# Patient Record
Sex: Male | Born: 1975 | Race: Black or African American | Hispanic: No | Marital: Single
Health system: Midwestern US, Community
[De-identification: ages and names within clinical notes are randomized; demographics above are authoritative.]

## PROBLEM LIST (undated history)

## (undated) ENCOUNTER — Ambulatory Visit (HOSPITAL_COMMUNITY): Payer: PRIVATE HEALTH INSURANCE

---

## 2003-11-22 ENCOUNTER — Emergency Department (HOSPITAL_COMMUNITY): Admission: EM | Admit: 2003-11-22 | Discharge: 2003-11-22 | Payer: Self-pay | Admitting: Emergency Medicine

## 2015-12-11 ENCOUNTER — Encounter (HOSPITAL_COMMUNITY): Payer: Self-pay | Admitting: *Deleted

## 2015-12-11 DIAGNOSIS — F419 Anxiety disorder, unspecified: Secondary | ICD-10-CM | POA: Diagnosis not present

## 2015-12-11 DIAGNOSIS — R071 Chest pain on breathing: Secondary | ICD-10-CM | POA: Diagnosis present

## 2015-12-11 DIAGNOSIS — F17200 Nicotine dependence, unspecified, uncomplicated: Secondary | ICD-10-CM | POA: Insufficient documentation

## 2015-12-11 NOTE — ED Triage Notes (Signed)
The pt is c/o chest pain since early this am sl sob  He has a history of the same no cardiac history

## 2015-12-12 ENCOUNTER — Emergency Department (HOSPITAL_COMMUNITY): Payer: BLUE CROSS/BLUE SHIELD

## 2015-12-12 ENCOUNTER — Emergency Department (HOSPITAL_COMMUNITY)
Admission: EM | Admit: 2015-12-12 | Discharge: 2015-12-12 | Disposition: A | Discharge status: Home / Self Care | Payer: BLUE CROSS/BLUE SHIELD | Attending: Emergency Medicine | Admitting: Emergency Medicine

## 2015-12-12 DIAGNOSIS — F419 Anxiety disorder, unspecified: Secondary | ICD-10-CM

## 2015-12-12 DIAGNOSIS — R0789 Other chest pain: Secondary | ICD-10-CM

## 2015-12-12 LAB — BASIC METABOLIC PANEL
Anion gap: 9 (ref 5–15)
BUN: 10 mg/dL (ref 6–20)
CHLORIDE: 105 mmol/L (ref 101–111)
CO2: 24 mmol/L (ref 22–32)
CREATININE: 1.22 mg/dL (ref 0.61–1.24)
Calcium: 9.3 mg/dL (ref 8.9–10.3)
GFR calc Af Amer: >60 mL/min (ref >60)
GFR calc non Af Amer: >60 mL/min (ref >60)
Glucose, Bld: 106 mg/dL — ABNORMAL HIGH (ref 65–99)
POTASSIUM: 3.4 mmol/L — AB (ref 3.5–5.1)
Sodium: 138 mmol/L (ref 135–145)

## 2015-12-12 LAB — CBC
HEMATOCRIT: 43.5 % (ref 39.0–52.0)
Hemoglobin: 15.2 g/dL (ref 13.0–17.0)
MCH: 28.1 pg (ref 26.0–34.0)
MCHC: 34.9 g/dL (ref 30.0–36.0)
MCV: 80.6 fL (ref 78.0–100.0)
PLATELETS: 166 10*3/uL (ref 150–400)
RBC: 5.4 MIL/uL (ref 4.22–5.81)
RDW: 12.5 % (ref 11.5–15.5)
WBC: 5.2 10*3/uL (ref 4.0–10.5)

## 2015-12-12 LAB — TROPONIN I: Troponin I: <0.03 ng/mL (ref <0.03)

## 2015-12-12 LAB — I-STAT TROPONIN, ED: Troponin i, poc: 0 ng/mL (ref 0.00–0.08)

## 2015-12-12 MED ORDER — IBUPROFEN 600 MG PO TABS: 600.0000 mg | ORAL_TABLET | Freq: Four times a day (QID) | ORAL | 0 refills | Status: AC | PRN

## 2015-12-12 MED ORDER — GI COCKTAIL ~~LOC~~
30.0000 mL | Freq: Once | ORAL | Status: AC
  Administered 2015-12-12: 30 mL via ORAL
  Filled 2015-12-12: qty 30

## 2015-12-12 MED ORDER — LORAZEPAM 1 MG PO TABS
1.0000 mg | ORAL_TABLET | Freq: Once | ORAL | Status: AC
  Administered 2015-12-12: 1 mg via ORAL
  Filled 2015-12-12: qty 1

## 2015-12-12 MED ORDER — LORAZEPAM 0.5 MG PO TABS: 0.5000 mg | ORAL_TABLET | Freq: Three times a day (TID) | ORAL | 0 refills | Status: AC | PRN

## 2015-12-12 NOTE — ED Notes (Signed)
ED Provider at bedside. 

## 2015-12-12 NOTE — ED Provider Notes (Signed)
[facility] Provider Note  CSN: [id number] Arrival date & time: 12/11/15  2345  History   Chief Complaint Chief Complaint  Patient presents with  . Chest Pain    HPI Maurice DawleyMichael Misenheimer is a 40 y.o. male.  HPI  Patient presented to the emergency department for evaluation of chest pain started while he was working on writing. He sees a Therapist, sportspsychiatrist and takes Vyvanse. He also suffers from significant anxiety and says that a lot has been going on. He states that while being concerned about a situation he developed left-sided chest pain shortness of breath. The pain lasts 1-2 minutes when it, and they will go away for a few minutes up to an hour before he returns again. The pain worsens with a large breath. He endorses feeling anxious. He is not had any radiation, diaphoresis, nausea, vomiting, cough, fevers, abdominal pain, back pain, weakness or any other associated symptoms. He denies history of cardiac disease.  History reviewed. No pertinent past medical history.  There are no active problems to display for this patient.   History reviewed. No pertinent surgical history.     Home Medications    Prior to Admission medications   Medication Sig Start Date End Date Taking? Authorizing Provider  ibuprofen (ADVIL,MOTRIN) 600 MG tablet Take 1 tablet (600 mg total) by mouth every 6 (six) hours as needed. 12/12/15   Tanishi Nault Neva SeatGreene, PA-C  LORazepam (ATIVAN) 0.5 MG tablet Take 1-2 tablets (0.5-1 mg total) by mouth 3 (three) times daily as needed for anxiety. 12/12/15   Donicia Druck Neva SeatGreene, PA-C  VYVANSE 40 MG capsule Take 40 mg by mouth daily. 11/22/15   Historical Provider, MD    Family History No family history on file.  Social History Social History  Substance Use Topics  . Smoking status: Light Tobacco Smoker  . Smokeless tobacco: Never Used  . Alcohol use Yes     Allergies   Review of patient's allergies indicates no known allergies.   Review of Systems Review of  Systems  Review of Systems All other systems negative except as documented in the HPI. All pertinent positives and negatives as reviewed in the HPI.  Physical Exam Updated Vital Signs BP 141/84   Pulse (!) 59   Temp 98.1 F (36.7 C) (Oral)   Resp 17   Ht 5\' 10"  (1.778 m)   Wt 95.3 kg   SpO2 96%   BMI 30.13 kg/m   Physical Exam  Constitutional: He appears well-developed and well-nourished. No distress.  HENT:  Head: Normocephalic and atraumatic.  Right Ear: Tympanic membrane and ear canal normal.  Left Ear: Tympanic membrane and ear canal normal.  Nose: Nose normal.  Mouth/Throat: Uvula is midline, oropharynx is clear and moist and mucous membranes are normal.  Eyes: Pupils are equal, round, and reactive to light.  Neck: Normal range of motion. Neck supple.  Cardiovascular: Normal rate and regular rhythm.   Pulmonary/Chest: Effort normal and breath sounds normal. No apnea. No respiratory distress. He exhibits tenderness.    Abdominal: Soft.  No signs of abdominal distention  Musculoskeletal:  No LE swelling  Neurological: He is alert.  Acting at baseline  Skin: Skin is warm and dry. No rash noted.  Psychiatric: His mood appears anxious.  Nursing note and vitals reviewed.    ED Treatments / Results  Labs (all labs ordered are listed, but only abnormal results are displayed) Labs Reviewed  BASIC METABOLIC PANEL - Abnormal; Notable for the following:  Result Value   Potassium 3.4 (*)    Glucose, Bld 106 (*)    All other components within normal limits  CBC  TROPONIN I  I-STAT TROPOININ, ED    EKG  EKG Interpretation None       Radiology Dg Chest 2 View  Result Date: 12/12/2015 CLINICAL DATA:  Acute onset of left-sided chest burning. Initial encounter. EXAM: CHEST  2 VIEW COMPARISON:  None. FINDINGS: The lungs are well-aerated and clear. There is no evidence of focal opacification, pleural effusion or pneumothorax. The heart is normal in size; the  mediastinal contour is within normal limits. No acute osseous abnormalities are seen. IMPRESSION: No acute cardiopulmonary process seen. Electronically Signed   By: Roanna RaiderJeffery  Chang M.D.   On: 12/12/2015 01:09    Procedures Procedures (including critical care time)  Medications Ordered in ED Medications  LORazepam (ATIVAN) tablet 1 mg (1 mg Oral Given 12/12/15 0255)  gi cocktail (Maalox,Lidocaine,Donnatal) (30 mLs Oral Given 12/12/15 0255)     Initial Impression / Assessment and Plan / ED Course  I have reviewed the triage vital signs and the nursing notes.  Pertinent labs & imaging results that were available during my care of the patient were reviewed by me and considered in my medical decision making (see chart for details).  Clinical Course    Patient had a normal chest x-ray, CBC, BMP and normal troponins 2. He has a high score of 0 and is very low risk for acute coronary syndrome. He was given Ativan and a GI cocktail in the emergency department and reports feeling significantly better. He requests prescription for Ativan and plans to talk to a psychiatrist regarding his anxiety. At this time he has been monitored and is pain-free. 11 follow-up with his primary care doctor and we discussed strict return to emergency department precautions.  Final Clinical Impressions(s) / ED Diagnoses   Final diagnoses:  Anxiety  Atypical chest pain    New Prescriptions New Prescriptions   IBUPROFEN (ADVIL,MOTRIN) 600 MG TABLET    Take 1 tablet (600 mg total) by mouth every 6 (six) hours as needed.   LORAZEPAM (ATIVAN) 0.5 MG TABLET    Take 1-2 tablets (0.5-1 mg total) by mouth 3 (three) times daily as needed for anxiety.     Marlon Peliffany Christeen Lai, PA-C 12/12/15 [id number]    Gilda Creasehristopher J Pollina, MD 12/12/15 [phone]

## 2016-10-15 ENCOUNTER — Emergency Department (HOSPITAL_COMMUNITY)
Admission: EM | Admit: 2016-10-15 | Discharge: 2016-10-15 | Disposition: A | Discharge status: Home / Self Care | Payer: Self-pay | Attending: Emergency Medicine | Admitting: Emergency Medicine

## 2016-10-15 ENCOUNTER — Encounter (HOSPITAL_COMMUNITY): Payer: Self-pay

## 2016-10-15 ENCOUNTER — Emergency Department (HOSPITAL_COMMUNITY): Payer: Self-pay

## 2016-10-15 DIAGNOSIS — R072 Precordial pain: Secondary | ICD-10-CM | POA: Insufficient documentation

## 2016-10-15 DIAGNOSIS — R030 Elevated blood-pressure reading, without diagnosis of hypertension: Secondary | ICD-10-CM | POA: Insufficient documentation

## 2016-10-15 DIAGNOSIS — F17200 Nicotine dependence, unspecified, uncomplicated: Secondary | ICD-10-CM | POA: Insufficient documentation

## 2016-10-15 DIAGNOSIS — K219 Gastro-esophageal reflux disease without esophagitis: Secondary | ICD-10-CM | POA: Insufficient documentation

## 2016-10-15 LAB — I-STAT TROPONIN, ED
Troponin i, poc: 0 ng/mL (ref 0.00–0.08)
Troponin i, poc: 0 ng/mL (ref 0.00–0.08)

## 2016-10-15 LAB — BASIC METABOLIC PANEL
Anion gap: 10 (ref 5–15)
BUN: 11 mg/dL (ref 6–20)
CHLORIDE: 102 mmol/L (ref 101–111)
CO2: 26 mmol/L (ref 22–32)
CREATININE: 1.35 mg/dL — AB (ref 0.61–1.24)
Calcium: 9.6 mg/dL (ref 8.9–10.3)
GFR calc Af Amer: >60 mL/min (ref >60)
GLUCOSE: 96 mg/dL (ref 65–99)
Potassium: 3.8 mmol/L (ref 3.5–5.1)
SODIUM: 138 mmol/L (ref 135–145)

## 2016-10-15 LAB — CBC
HEMATOCRIT: 44.5 % (ref 39.0–52.0)
HEMOGLOBIN: 15.3 g/dL (ref 13.0–17.0)
MCH: 27.4 pg (ref 26.0–34.0)
MCHC: 34.4 g/dL (ref 30.0–36.0)
MCV: 79.6 fL (ref 78.0–100.0)
Platelets: 162 10*3/uL (ref 150–400)
RBC: 5.59 MIL/uL (ref 4.22–5.81)
RDW: 12.7 % (ref 11.5–15.5)
WBC: 4.9 10*3/uL (ref 4.0–10.5)

## 2016-10-15 MED ORDER — GI COCKTAIL ~~LOC~~
30.0000 mL | Freq: Once | ORAL | Status: AC
  Administered 2016-10-15: 30 mL via ORAL
  Filled 2016-10-15: qty 30

## 2016-10-15 MED ORDER — OMEPRAZOLE 20 MG PO CPDR: 20.0000 mg | DELAYED_RELEASE_CAPSULE | Freq: Every day | ORAL | 0 refills | Status: AC

## 2016-10-15 NOTE — ED Notes (Signed)
ED Provider at bedside. 

## 2016-10-15 NOTE — ED Notes (Signed)
Pt's wife at desk inquiring about wait times

## 2016-10-15 NOTE — ED Notes (Signed)
Pt stat he understands iinstructions. Home stable with wife. With steady gait.

## 2016-10-15 NOTE — ED Notes (Signed)
Pt stats he had severe chest pain earlier weith short of breath. Pt eating muffin and NAD at present.

## 2016-10-15 NOTE — ED Triage Notes (Addendum)
Per Pt, Pt is coming from home with intermittent left to mid-center chest pain that started about a week ago. Pt reports some SOB and lightheadedness. Denies N/V/D, Numbness or Tingling.

## 2016-10-15 NOTE — ED Provider Notes (Signed)
[facility] Provider Note   CSN: [id number] Arrival date & time: 10/15/16  1248     History   Chief Complaint Chief Complaint  Patient presents with  . Chest Pain    HPI Maurice DawleyMichael Gonzales is a 41 y.o. male.  Maurice DawleyMichael Gonzales is a 41 y.o. Male who presents to the ED complaining of chest pain ongoing for 3-4 weeks. He reports his pain has gradually become worse over the past few weeks. He reports substernal and left sided chest pain that fluctuates in intensity. He reports he has pain everyday but not when he is sleeping. He woke up this morning with pain that then became worse before he got to the ER today. He reports he had more severe pain prior to the ER today with feeling like there is a knot in his chest and that it hurt to breathe. He does not have this pain any longer. He is unable to identify any alleviating or aggravating factors for his chest pain. He denies worsening pain with exertion, breathing, eating or laying down. He does report associated acid reflux symptoms as well as some anxiety. He was seen in the emergency department for chest pain previously and was directed to follow-up with primary care and cardiology. He is yet to follow-up with cardiology. He does have an appointment next week with his primary care doctor. He is not a smoker. He denies history of hypertension or hyperlipidemia. He denies fevers, coughing, neck pain, numbness, tingling, weakness, changes to his vision, back pain, lightheadedness, seen to be, leg pain, leg swelling, recent long travel or rashes.   The history is provided by the patient, medical records and the spouse. No language interpreter was used.  Chest Pain   Pertinent negatives include no abdominal pain, no back pain, no cough, no fever, no headaches, no nausea, no numbness, no palpitations, no shortness of breath, no vomiting and no weakness.    History reviewed. No pertinent past medical history.  There are no active problems to display  for this patient.   History reviewed. No pertinent surgical history.     Home Medications    Prior to Admission medications   Medication Sig Start Date End Date Taking? Authorizing Provider  ibuprofen (ADVIL,MOTRIN) 600 MG tablet Take 1 tablet (600 mg total) by mouth every 6 (six) hours as needed. 12/12/15   Marlon PelGreene, Tiffany, PA-C  LORazepam (ATIVAN) 0.5 MG tablet Take 1-2 tablets (0.5-1 mg total) by mouth 3 (three) times daily as needed for anxiety. 12/12/15   Marlon PelGreene, Tiffany, PA-C  omeprazole (PRILOSEC) 20 MG capsule Take 1 capsule (20 mg total) by mouth daily. 10/15/16   Everlene Farrieransie, Zeeshan Korte, PA-C  VYVANSE 40 MG capsule Take 40 mg by mouth daily. 11/22/15   [provider]    Family History No family history on file.  Social History Social History  Substance Use Topics  . Smoking status: Light Tobacco Smoker  . Smokeless tobacco: Never Used  . Alcohol use Yes     Comment: Occasiocally      Allergies   Patient has no known allergies.   Review of Systems Review of Systems  Constitutional: Negative for chills and fever.  HENT: Negative for congestion and sore throat.   Eyes: Negative for visual disturbance.  Respiratory: Negative for cough, shortness of breath and wheezing.   Cardiovascular: Positive for chest pain. Negative for palpitations and leg swelling.  Gastrointestinal: Negative for abdominal pain, diarrhea, nausea and vomiting.  Genitourinary: Negative for dysuria.  Musculoskeletal:  Negative for back pain and neck pain.  Skin: Negative for rash.  Neurological: Negative for syncope, weakness, light-headedness, numbness and headaches.     Physical Exam Updated Vital Signs BP (!) 132/100   Pulse 66   Temp 98 F (36.7 C) (Oral)   Resp 16   Ht  (1.778 m)   Wt 99.8 kg (220 lb)   SpO2 94%   BMI 31.57 kg/m   Physical Exam  Constitutional: He appears well-developed and well-nourished. No distress.  Nontoxic appearing.  HENT:  Head: Normocephalic  and atraumatic.  Right Ear: External ear normal.  Left Ear: External ear normal.  Mouth/Throat: Oropharynx is clear and moist.  Eyes: Pupils are equal, round, and reactive to light. Conjunctivae are normal. Right eye exhibits no discharge. Left eye exhibits no discharge.  Neck: Neck supple. No JVD present.  Cardiovascular: Normal rate, regular rhythm, normal heart sounds and intact distal pulses.  Exam reveals no gallop and no friction rub.   No murmur heard. Bilateral radial, posterior tibialis and dorsalis pedis pulses are intact.    Pulmonary/Chest: Effort normal and breath sounds normal. No stridor. No respiratory distress. He has no wheezes. He has no rales. He exhibits no tenderness.  Lungs are clear to ascultation bilaterally. Symmetric chest expansion bilaterally. No increased work of breathing. No rales or rhonchi.    Abdominal: Soft. There is no tenderness. There is no guarding.  Musculoskeletal: He exhibits no edema or tenderness.  No lower extremity edema or tenderness.  Lymphadenopathy:    He has no cervical adenopathy.  Neurological: He is alert. No sensory deficit. Coordination normal.  Skin: Skin is warm and dry. Capillary refill takes less than 2 seconds. No rash noted. He is not diaphoretic. No erythema. No pallor.  Psychiatric: He has a normal mood and affect. His behavior is normal.  Nursing note and vitals reviewed.    ED Treatments / Results  Labs (all labs ordered are listed, but only abnormal results are displayed) Labs Reviewed  BASIC METABOLIC PANEL - Abnormal; Notable for the following:       Result Value   Creatinine, Ser 1.35 (*)    All other components within normal limits  CBC  I-STAT TROPONIN, ED  I-STAT TROPONIN, ED    EKG  EKG Interpretation  Date/Time:  Saturday October 15 2016 12:50:22 EDT Ventricular Rate:  71 PR Interval:  148 QRS Duration: 82 QT Interval:  360 QTC Calculation: 391 R Axis:   -15 Text Interpretation:  Normal sinus  rhythm Nonspecific T wave abnormality Abnormal ECG Confirmed by Ranae Palms  MD, DAVID ([id number]) on 10/15/2016 4:43:29 PM       Radiology Dg Chest 2 View  Result Date: 10/15/2016 CLINICAL DATA:  Chest pain. EXAM: CHEST  2 VIEW COMPARISON:  Radiographs of December 12, 2015. FINDINGS: The heart size and mediastinal contours are within normal limits. Both lungs are clear. No pneumothorax or pleural effusion is noted. The visualized skeletal structures are unremarkable. IMPRESSION: No active cardiopulmonary disease. Electronically Signed   By: Lupita Raider, M.D.   On: 10/15/2016 13:43    Procedures Procedures (including critical care time)  Medications Ordered in ED Medications  gi cocktail (Maalox,Lidocaine,Donnatal) (30 mLs Oral Given 10/15/16 1750)     Initial Impression / Assessment and Plan / ED Course  I have reviewed the triage vital signs and the nursing notes.  Pertinent labs & imaging results that were available during my care of the patient were reviewed by me and  considered in my medical decision making (see chart for details).     This is a 41 y.o. Male who presents to the ED complaining of chest pain ongoing for 3-4 weeks. He reports his pain has gradually become worse over the past few weeks. He reports substernal and left sided chest pain that fluctuates in intensity. He reports he has pain everyday but not when he is sleeping. He woke up this morning with pain that then became worse before he got to the ER today. He reports he had more severe pain prior to the ER today with feeling like there is a knot in his chest and that it hurt to breathe. He does not have this pain any longer. He is unable to identify any alleviating or aggravating factors for his chest pain. He denies worsening pain with exertion, breathing, eating or laying down. He does report associated acid reflux symptoms as well as some anxiety. He does have an appointment next week with his primary care doctor. He is not  a smoker. He denies history of hypertension or hyperlipidemia. On examination patient is afebrile and nontoxic appearing. Lungs are clear to auscultation bilaterally. No hypoxia, tachypnea or tachycardia on exam. Blood pressure is noted to be elevated. He denies history of hypertension. He is not on any antihypertensives. EKG shows nonspecific T-wave abnormality. No evidence of STEMI. Chest x-ray is unremarkable. BMP is normal for creatinine of 1.35. Most recent creatinine is about 8 months ago and was 1.22.CBC is within normal limits. Initial troponin is not elevated. Delta troponin is also not elevated. Doubt ACS or PE.  During the patient's emergency department visit he denies any chest pain to me. He did receive a GI cocktail and prior to discharge she reports he has no pain. Hedoes report some anxiety as well as acid reflux symptoms. This might be recurrent contributing to his symptoms. He does have elevated blood pressure and I feel he probably needs close follow-up by primary care for blood pressure recheck. I also noted that his creatinine is somewhat elevated, although very close to his baseline from his most recent blood work. I did encourage him to have his blood pressure and creatinine rechecked by primary care at his appointment next week. He agrees with this plan. Will start him on omeprazole to see if this helps with his symptoms. I discussed strict and specific return precautions with the patient. I advised the patient to follow-up with their primary care provider this week. I advised the patient to return to the emergency department with new or worsening symptoms or new concerns. The patient verbalized understanding and agreement with plan.   This patient was discussed with Dr. Ranae Palms who agrees with assessment and plan.   Final Clinical Impressions(s) / ED Diagnoses   Final diagnoses:  Precordial pain  Gastroesophageal reflux disease, esophagitis presence not specified  Elevated blood  pressure reading    New Prescriptions New Prescriptions   OMEPRAZOLE (PRILOSEC) 20 MG CAPSULE    Take 1 capsule (20 mg total) by mouth daily.     Everlene Farrier, PA-C 10/15/16 1810    Loren Racer, MD 10/16/16 [phone]

## 2017-05-05 ENCOUNTER — Emergency Department (HOSPITAL_COMMUNITY)
Admission: EM | Admit: 2017-05-05 | Discharge: 2017-05-05 | Disposition: A | Discharge status: Home / Self Care | Payer: BLUE CROSS/BLUE SHIELD | Attending: Emergency Medicine | Admitting: Emergency Medicine

## 2017-05-05 ENCOUNTER — Encounter (HOSPITAL_COMMUNITY): Payer: Self-pay | Admitting: *Deleted

## 2017-05-05 ENCOUNTER — Emergency Department (HOSPITAL_COMMUNITY): Payer: BLUE CROSS/BLUE SHIELD

## 2017-05-05 ENCOUNTER — Other Ambulatory Visit: Payer: Self-pay

## 2017-05-05 DIAGNOSIS — R079 Chest pain, unspecified: Secondary | ICD-10-CM | POA: Insufficient documentation

## 2017-05-05 DIAGNOSIS — Z5321 Procedure and treatment not carried out due to patient leaving prior to being seen by health care provider: Secondary | ICD-10-CM | POA: Insufficient documentation

## 2017-05-05 LAB — BASIC METABOLIC PANEL
Anion gap: 11 (ref 5–15)
BUN: 8 mg/dL (ref 6–20)
CHLORIDE: 101 mmol/L (ref 101–111)
CO2: 25 mmol/L (ref 22–32)
CREATININE: 1.3 mg/dL — AB (ref 0.61–1.24)
Calcium: 9.4 mg/dL (ref 8.9–10.3)
GFR calc non Af Amer: >60 mL/min (ref >60)
Glucose, Bld: 117 mg/dL — ABNORMAL HIGH (ref 65–99)
Potassium: 3.6 mmol/L (ref 3.5–5.1)
Sodium: 137 mmol/L (ref 135–145)

## 2017-05-05 LAB — CBC
HCT: 46.8 % (ref 39.0–52.0)
Hemoglobin: 16.3 g/dL (ref 13.0–17.0)
MCH: 28.4 pg (ref 26.0–34.0)
MCHC: 34.8 g/dL (ref 30.0–36.0)
MCV: 81.7 fL (ref 78.0–100.0)
PLATELETS: 177 10*3/uL (ref 150–400)
RBC: 5.73 MIL/uL (ref 4.22–5.81)
RDW: 12.6 % (ref 11.5–15.5)
WBC: 6.4 10*3/uL (ref 4.0–10.5)

## 2017-05-05 LAB — I-STAT TROPONIN, ED: TROPONIN I, POC: 0 ng/mL (ref 0.00–0.08)

## 2017-05-05 NOTE — ED Triage Notes (Signed)
Pt in c/o L sided CP that radiates R arm onset today that is intermittent, pt reports intermittent pain, pt denies current SOB, pt reports x 2 emesis episodes with episode today, v/d, A&O x4

## 2018-04-08 ENCOUNTER — Other Ambulatory Visit: Payer: Self-pay

## 2018-04-08 ENCOUNTER — Encounter (HOSPITAL_COMMUNITY): Payer: Self-pay

## 2018-04-08 ENCOUNTER — Ambulatory Visit (HOSPITAL_COMMUNITY)
Admission: EM | Admit: 2018-04-08 | Discharge: 2018-04-08 | Disposition: A | Discharge status: Home / Self Care | Payer: BLUE CROSS/BLUE SHIELD | Attending: Family Medicine | Admitting: Family Medicine

## 2018-04-08 DIAGNOSIS — H109 Unspecified conjunctivitis: Secondary | ICD-10-CM

## 2018-04-08 MED ORDER — TOBRAMYCIN 0.3 % OP SOLN: 1.0000 [drp] | OPHTHALMIC | 0 refills | Status: AC

## 2018-04-08 MED ORDER — AMOXICILLIN 875 MG PO TABS: 875.0000 mg | ORAL_TABLET | Freq: Two times a day (BID) | ORAL | 0 refills | Status: DC

## 2018-04-08 NOTE — ED Provider Notes (Signed)
[facility]    CSN: [id number] Arrival date & time: 04/08/18  1015     History   Chief Complaint Chief Complaint  Patient presents with  . Conjunctivitis  . Cough    HPI Maurice Gonzales is a 43 y.o. male.   HPI  Maurice Gonzales has a 20-year-old son.  The son is getting over a cold and ear infection with conjunctivitis.  Now patient has a cold, runny stuffy nose, and redness of his right eye.  He has some swelling of the upper lid.  Vision is normal. No fever or chills.  No coughing or chest congestion.  History reviewed. No pertinent past medical history.  There are no active problems to display for this patient.   History reviewed. No pertinent surgical history.     Home Medications    Prior to Admission medications   Medication Sig Start Date End Date Taking? Authorizing Provider  amoxicillin (AMOXIL) 875 MG tablet Take 1 tablet (875 mg total) by mouth 2 (two) times daily. 04/08/18   Eustace Moore, MD  ibuprofen (ADVIL,MOTRIN) 600 MG tablet Take 1 tablet (600 mg total) by mouth every 6 (six) hours as needed. 12/12/15   Marlon Pel, PA-C  LORazepam (ATIVAN) 0.5 MG tablet Take 1-2 tablets (0.5-1 mg total) by mouth 3 (three) times daily as needed for anxiety. 12/12/15   Marlon Pel, PA-C  omeprazole (PRILOSEC) 20 MG capsule Take 1 capsule (20 mg total) by mouth daily. 10/15/16   Everlene Farrier, PA-C  tobramycin (TOBREX) 0.3 % ophthalmic solution Place 1 drop into both eyes every 4 (four) hours. 04/08/18   Eustace Moore, MD  VYVANSE 40 MG capsule Take 40 mg by mouth daily. 11/22/15   [provider]    Family History History reviewed. No pertinent family history.  Social History Social History   Tobacco Use  . Smoking status: Light Tobacco Smoker  . Smokeless tobacco: Never Used  Substance Use Topics  . Alcohol use: Yes    Comment: Occasiocally   . Drug use: No     Allergies   Patient has no known allergies.   Review of  Systems Review of Systems  Constitutional: Negative for chills and fever.  HENT: Positive for congestion, postnasal drip and rhinorrhea. Negative for ear pain and sore throat.   Eyes: Positive for redness. Negative for pain and visual disturbance.  Respiratory: Negative for cough and shortness of breath.   Cardiovascular: Negative for chest pain and palpitations.  Gastrointestinal: Negative for abdominal pain and vomiting.  Genitourinary: Negative for dysuria and hematuria.  Musculoskeletal: Negative for arthralgias and back pain.  Skin: Negative for color change and rash.  Neurological: Negative for seizures and syncope.  All other systems reviewed and are negative.    Physical Exam Triage Vital Signs ED Triage Vitals  Enc Vitals Group     BP 04/08/18 1043 135/74     Pulse Rate 04/08/18 1043 71     Resp 04/08/18 1043 16     Temp 04/08/18 1043 98.1 F (36.7 C)     Temp Source 04/08/18 1043 Oral     SpO2 04/08/18 1043 98 %     Weight 04/08/18 1047 235 lb (106.6 kg)     Height --      Head Circumference --      Peak Flow --      Pain Score 04/08/18 1047 6     Pain Loc --      Pain Edu? --  Excl. in GC? --    No data found.  Updated Vital Signs BP 135/74 (BP Location: Left Arm)   Pulse 71   Temp 98.1 F (36.7 C) (Oral)   Resp 16   Wt 106.6 kg   SpO2 98%   BMI 33.72 kg/m   Visual Acuity Right Eye Distance:   Left Eye Distance:   Bilateral Distance:    Right Eye Near:   Left Eye Near:    Bilateral Near:     Physical Exam Constitutional:      General: He is not in acute distress.    Appearance: He is well-developed and normal weight. He is ill-appearing.  HENT:     Head: Normocephalic and atraumatic.     Right Ear: Tympanic membrane, ear canal and external ear normal.     Left Ear: Ear canal and external ear normal.     Nose: Congestion present.     Mouth/Throat:     Mouth: Mucous membranes are moist.     Pharynx: Posterior oropharyngeal erythema  present.  Eyes:     Conjunctiva/sclera: Conjunctivae normal.     Pupils: Pupils are equal, round, and reactive to light.     Comments: Right eye has moderate conjunctival injection.  Yellow discharge.  Mild swelling and tenderness of the upper lid.  Neck:     Musculoskeletal: Normal range of motion.  Cardiovascular:     Rate and Rhythm: Normal rate and regular rhythm.  Pulmonary:     Effort: Pulmonary effort is normal. No respiratory distress.     Breath sounds: Normal breath sounds.  Abdominal:     General: There is no distension.     Palpations: Abdomen is soft.  Musculoskeletal: Normal range of motion.  Skin:    General: Skin is warm and dry.  Neurological:     Mental Status: He is alert.      UC Treatments / Results  Labs (all labs ordered are listed, but only abnormal results are displayed) Labs Reviewed - No data to display  EKG None  Radiology No results found.  Procedures Procedures (including critical care time)  Medications Ordered in UC Medications - No data to display  Initial Impression / Assessment and Plan / UC Course  I have reviewed the triage vital signs and the nursing notes.  Pertinent labs & imaging results that were available during my care of the patient were reviewed by me and considered in my medical decision making (see chart for details).     I know the patient has an upper respiratory infection that is likely a virus, however, with swelling of his lid and tenderness I am worried about his conjunctivitis.  I am going to give him amoxicillin and eyedrops to try to treat this.  He will return if he is not better in a few days Final Clinical Impressions(s) / UC Diagnoses   Final diagnoses:  Bacterial conjunctivitis of right eye     Discharge Instructions     Take the amoxicillin 2 x a day May take tylenol or ibuprofen for pain and fever Use eye drops for 5-7 days until eye clears Follow up if you fail to improve   ED  Prescriptions    Medication Sig Dispense Auth. Provider   amoxicillin (AMOXIL) 875 MG tablet Take 1 tablet (875 mg total) by mouth 2 (two) times daily. 14 tablet Eustace Moore, MD   tobramycin (TOBREX) 0.3 % ophthalmic solution Place 1 drop into both eyes every 4 (  four) hours. 5 mL Eustace Moore, MD     Controlled Substance Prescriptions [place] Controlled Substance Registry consulted? Not Applicable   Eustace Moore, MD 04/08/18 [phone]

## 2018-04-08 NOTE — Discharge Instructions (Signed)
Take the amoxicillin 2 x a day May take tylenol or ibuprofen for pain and fever Use eye drops for 5-7 days until eye clears Follow up if you fail to improve

## 2018-04-08 NOTE — ED Triage Notes (Signed)
Pt cc left eye may have the pink eye. Pt has a cold, cough and congestion.

## 2018-06-18 ENCOUNTER — Encounter (HOSPITAL_COMMUNITY): Payer: Self-pay

## 2018-06-18 ENCOUNTER — Other Ambulatory Visit: Payer: Self-pay

## 2018-06-18 ENCOUNTER — Ambulatory Visit (HOSPITAL_COMMUNITY)
Admission: EM | Admit: 2018-06-18 | Discharge: 2018-06-18 | Disposition: A | Discharge status: Home / Self Care | Payer: Self-pay | Attending: Family Medicine | Admitting: Family Medicine

## 2018-06-18 DIAGNOSIS — K120 Recurrent oral aphthae: Secondary | ICD-10-CM | POA: Insufficient documentation

## 2018-06-18 LAB — POCT RAPID STREP A: Streptococcus, Group A Screen (Direct): NEGATIVE

## 2018-06-18 MED ORDER — LIDOCAINE VISCOUS HCL 2 % MT SOLN
15.0000 mL | OROMUCOSAL | 0 refills | Status: AC | PRN
Start: 2018-06-18 — End: ?

## 2018-06-18 NOTE — ED Triage Notes (Signed)
Patient presents to Urgent Care with complaints of sore throat since yesterday when he noticed his uvula was inflamed. Patient reports he noticed this morning some white spots on the back of his throat as well, pt denies fevers.

## 2018-06-18 NOTE — Discharge Instructions (Addendum)
These ulcers are most likely viral and should resolve on their own and approximately 1 to 2 weeks. You may use viscous lidocaine provided prior to meals to help with any discomfort  Please continue to monitor your symptoms over the next week, if you start to develop fever, swelling, increased pain, difficulty swallowing or difficulty breathing please follow-up here or in the emergency room.

## 2018-06-18 NOTE — ED Provider Notes (Signed)
[facility]    CSN: [id number] Arrival date & time: 06/18/18  1406     History   Chief Complaint Chief Complaint  Patient presents with  . Sore Throat    HPI Maurice Gonzales is a 43 y.o. male no significant past medical history presenting today for evaluation of sores on his mouth.  Patient notes that over the past couple days he has had some mild discomfort in his throat.  He notes that his pain is rated 1.5 out of 10.  He denies any swelling.  Denies difficulty swallowing.  Denies difficulty breathing.  Denies chest pain or shortness of breath.  Has had minimal cough and some mild rhinorrhea which she has attributed to allergies.  He denies any fevers.  Denies any new medicines.  Denies close contacts with similar symptoms.  HPI  History reviewed. No pertinent past medical history.  There are no active problems to display for this patient.   History reviewed. No pertinent surgical history.     Home Medications    Prior to Admission medications   Medication Sig Start Date End Date Taking? Authorizing Provider  ibuprofen (ADVIL,MOTRIN) 600 MG tablet Take 1 tablet (600 mg total) by mouth every 6 (six) hours as needed. 12/12/15   Marlon PelGreene, Tiffany, PA-C  lidocaine (XYLOCAINE) 2 % solution Use as directed 15 mLs in the mouth or throat every 4 (four) hours as needed for mouth pain. May swish and spit 06/18/18   Wieters, Hallie C, PA-C  LORazepam (ATIVAN) 0.5 MG tablet Take 1-2 tablets (0.5-1 mg total) by mouth 3 (three) times daily as needed for anxiety. 12/12/15   Marlon PelGreene, Tiffany, PA-C  omeprazole (PRILOSEC) 20 MG capsule Take 1 capsule (20 mg total) by mouth daily. 10/15/16   Everlene Farrieransie, William, PA-C  tobramycin (TOBREX) 0.3 % ophthalmic solution Place 1 drop into both eyes every 4 (four) hours. 04/08/18   Eustace MooreNelson, Yvonne Sue, MD  VYVANSE 40 MG capsule Take 40 mg by mouth daily. 11/22/15   [provider]    Family History History reviewed. No pertinent family history.   Social History Social History   Tobacco Use  . Smoking status: Light Tobacco Smoker    Types: Cigars  . Smokeless tobacco: Never Used  . Tobacco comment: smokes cigar every "once in a blue moon"  Substance Use Topics  . Alcohol use: Yes    Comment: Occasiocally   . Drug use: No     Allergies   Patient has no known allergies.   Review of Systems Review of Systems  Constitutional: Negative for activity change, appetite change, chills, fatigue and fever.  HENT: Positive for sore throat. Negative for congestion, ear pain, rhinorrhea, sinus pressure and trouble swallowing.   Eyes: Negative for discharge and redness.  Respiratory: Negative for cough, chest tightness and shortness of breath.   Cardiovascular: Negative for chest pain.  Gastrointestinal: Negative for abdominal pain, diarrhea, nausea and vomiting.  Musculoskeletal: Negative for myalgias.  Skin: Negative for rash.  Neurological: Negative for dizziness, light-headedness and headaches.     Physical Exam Triage Vital Signs ED Triage Vitals  Enc Vitals Group     BP 06/18/18 1441 (!) 159/90     Pulse Rate 06/18/18 1441 73     Resp 06/18/18 1441 18     Temp 06/18/18 1441 98 F (36.7 C)     Temp Source 06/18/18 1441 Oral     SpO2 06/18/18 1441 100 %     Weight --  Height --      Head Circumference --      Peak Flow --      Pain Score 06/18/18 1439 2     Pain Loc --      Pain Edu? --      Excl. in GC? --    No data found.  Updated Vital Signs BP (!) 159/90 (BP Location: Right Arm)   Pulse 73   Temp 98 F (36.7 C) (Oral)   Resp 18   SpO2 100%   Visual Acuity Right Eye Distance:   Left Eye Distance:   Bilateral Distance:    Right Eye Near:   Left Eye Near:    Bilateral Near:     Physical Exam Vitals signs and nursing note reviewed.  Constitutional:      Appearance: He is well-developed.  HENT:     Head: Normocephalic and atraumatic.     Ears:     Comments: Bilateral ears without  tenderness to palpation of external auricle, tragus and mastoid, EAC's without erythema or swelling, TM's with good bony landmarks and cone of light. Non erythematous.     Nose:     Comments: Nasal mucosa slightly erythematous, bilateral turbinates enlarged, dried blood present in left nares    Mouth/Throat:     Comments: Oral mucosa pink and moist, no tonsillar enlargement bilaterally, uvula does appear erythematous and slightly enlarged with 3 white ulcerative lesions present, posterior pharynx patent, no soft palate swelling, no other lesions noted on oral mucosa or tongue  Eyes:     Conjunctiva/sclera: Conjunctivae normal.  Neck:     Musculoskeletal: Neck supple.  Cardiovascular:     Rate and Rhythm: Normal rate and regular rhythm.     Heart sounds: No murmur.  Pulmonary:     Effort: Pulmonary effort is normal. No respiratory distress.     Breath sounds: Normal breath sounds.     Comments: Breathing comfortably at rest, CTABL, no wheezing, rales or other adventitious sounds auscultated Abdominal:     Palpations: Abdomen is soft.     Tenderness: There is no abdominal tenderness.  Skin:    General: Skin is warm and dry.  Neurological:     Mental Status: He is alert.      UC Treatments / Results  Labs (all labs ordered are listed, but only abnormal results are displayed) Labs Reviewed  CULTURE, GROUP A STREP [facility])  POCT RAPID STREP A    EKG None  Radiology No results found.  Procedures Procedures (including critical care time)  Medications Ordered in UC Medications - No data to display  Initial Impression / Assessment and Plan / UC Course  I have reviewed the triage vital signs and the nursing notes.  Pertinent labs & imaging results that were available during my care of the patient were reviewed by me and considered in my medical decision making (see chart for details).    Strep test negative, lesions appear similar to aphthous ulcers versus infection.  Notes  that his uvula is normally the size.  Minimal pain.  Will treat symptomatically and supportively, viscous lidocaine as needed, would expect self resolution.  Continue to monitor for development of swelling, fevers or persistent symptoms.Discussed strict return precautions. Patient verbalized understanding and is agreeable with plan.  Final Clinical Impressions(s) / UC Diagnoses   Final diagnoses:  Aphthous ulcer of mouth     Discharge Instructions     These ulcers are most likely viral and should resolve on  their own and approximately 1 to 2 weeks. You may use viscous lidocaine provided prior to meals to help with any discomfort  Please continue to monitor your symptoms over the next week, if you start to develop fever, swelling, increased pain, difficulty swallowing or difficulty breathing please follow-up here or in the emergency room.   ED Prescriptions    Medication Sig Dispense Auth. Provider   lidocaine (XYLOCAINE) 2 % solution Use as directed 15 mLs in the mouth or throat every 4 (four) hours as needed for mouth pain. May swish and spit 100 mL Wieters, [place] C, PA-C     Controlled Substance Prescriptions [place] Controlled Substance Registry consulted? Not Applicable   Lew Dawes, [place] 06/18/18 1524

## 2018-06-21 LAB — CULTURE, GROUP A STREP (THRC)

## 2018-09-10 ENCOUNTER — Ambulatory Visit (INDEPENDENT_AMBULATORY_CARE_PROVIDER_SITE_OTHER): Payer: Self-pay

## 2018-09-10 ENCOUNTER — Other Ambulatory Visit: Payer: Self-pay

## 2018-09-10 ENCOUNTER — Encounter (HOSPITAL_COMMUNITY): Payer: Self-pay | Admitting: Emergency Medicine

## 2018-09-10 ENCOUNTER — Ambulatory Visit (HOSPITAL_COMMUNITY)
Admission: EM | Admit: 2018-09-10 | Discharge: 2018-09-10 | Disposition: A | Discharge status: Home / Self Care | Payer: Self-pay | Attending: Emergency Medicine | Admitting: Emergency Medicine

## 2018-09-10 DIAGNOSIS — S6991XA Unspecified injury of right wrist, hand and finger(s), initial encounter: Secondary | ICD-10-CM

## 2018-09-10 NOTE — Discharge Instructions (Signed)
Ice, elevation, ibuprofen or aleve as needed to help with swelling.  Brace for comfort and rest, wean out of it as able. Limit activity such as punching until swelling has improved.  I would recommend following up with sports medicine for further evaluation and recommendations.

## 2018-09-10 NOTE — ED Triage Notes (Signed)
Pt here for right hand pain after hitting punching bag yesterday

## 2018-09-10 NOTE — ED Provider Notes (Signed)
[facility]    CSN: [id number] Arrival date & time: 09/10/18  1858     History   Chief Complaint Chief Complaint  Patient presents with  . Hand Pain    HPI Maurice DawleyMichael Gonzales is a 43 y.o. male.   Maurice DawleyMichael Gross presents with complaints of right hand pain. 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 Did not have immediate pain but noted pain later that night. Swelling predominates to the right hand. Not necessarily painful. Hasn't taken any medications. No treatments. No previous injury to the hand. No numbness or tingling. No weakness. He is right handed. Without contributing medical history.      ROS per HPI, negative if not otherwise mentioned.      History reviewed. No pertinent past medical history.  There are no active problems to display for this patient.   History reviewed. No pertinent surgical history.     Home Medications    Prior to Admission medications   Medication Sig Start Date End Date Taking? Authorizing Provider  ibuprofen (ADVIL,MOTRIN) 600 MG tablet Take 1 tablet (600 mg total) by mouth every 6 (six) hours as needed. 12/12/15   Marlon PelGreene, Tiffany, PA-C  lidocaine (XYLOCAINE) 2 % solution Use as directed 15 mLs in the mouth or throat every 4 (four) hours as needed for mouth pain. May swish and spit 06/18/18   Wieters, Hallie C, PA-C  LORazepam (ATIVAN) 0.5 MG tablet Take 1-2 tablets (0.5-1 mg total) by mouth 3 (three) times daily as needed for anxiety. 12/12/15   Marlon PelGreene, Tiffany, PA-C  omeprazole (PRILOSEC) 20 MG capsule Take 1 capsule (20 mg total) by mouth daily. 10/15/16   Everlene Farrieransie, William, PA-C  tobramycin (TOBREX) 0.3 % ophthalmic solution Place 1 drop into both eyes every 4 (four) hours. 04/08/18   Eustace MooreNelson, Yvonne Sue, MD  VYVANSE 40 MG capsule Take 40 mg by mouth daily. 11/22/15   [provider]    Family History History reviewed. No pertinent family history.  Social History Social History   Tobacco Use  . Smoking  status: Light Tobacco Smoker    Types: Cigars  . Smokeless tobacco: Never Used  . Tobacco comment: smokes cigar every "once in a blue moon"  Substance Use Topics  . Alcohol use: Yes    Comment: Occasiocally   . Drug use: No     Allergies   Patient has no known allergies.   Review of Systems Review of Systems   Physical Exam Triage Vital Signs ED Triage Vitals  Enc Vitals Group     BP 09/10/18 1909 (!) 147/82     Pulse Rate 09/10/18 1909 76     Resp 09/10/18 1909 16     Temp 09/10/18 1909 98.3 F (36.8 C)     Temp Source 09/10/18 1909 Temporal     SpO2 09/10/18 1909 98 %     Weight --      Height --      Head Circumference --      Peak Flow --      Pain Score 09/10/18 1913 4     Pain Loc --      Pain Edu? --      Excl. in GC? --    No data found.  Updated Vital Signs BP (!) 147/82 (BP Location: Left Arm)   Pulse 76   Temp 98.3 F (36.8 C) (Temporal)   Resp 16   SpO2 98%    Physical Exam Constitutional:  Appearance: He is well-developed.  Cardiovascular:     Rate and Rhythm: Normal rate.  Pulmonary:     Effort: Pulmonary effort is normal.  Musculoskeletal:     Right hand: He exhibits swelling. He exhibits normal range of motion, no tenderness, no bony tenderness, normal two-point discrimination, normal capillary refill, no deformity and no laceration. Normal sensation noted. Normal strength noted.       Hands:     Comments: Full strength to right thumb but with obvious swelling, mild redness to soft tissue, no tenderness on palpation, no bony tenderness   Skin:    General: Skin is warm and dry.  Neurological:     Mental Status: He is alert and oriented to person, place, and time.      UC Treatments / Results  Labs (all labs ordered are listed, but only abnormal results are displayed) Labs Reviewed - No data to display  EKG   Radiology Dg Hand Complete Right  Result Date: 09/10/2018 CLINICAL DATA:  Pain after striking punching bag one  day prior, pain at first and second metacarpal. EXAM: RIGHT HAND - COMPLETE 3+ VIEW COMPARISON:  None. FINDINGS: There is no evidence of fracture or dislocation. There is no evidence of arthropathy or other focal bone abnormality. Soft tissues are unremarkable. IMPRESSION: Negative. Electronically Signed   By: Lovena Le M.D.   On: 09/10/2018 19:33    Procedures Procedures (including critical care time)  Medications Ordered in UC Medications - No data to display  Initial Impression / Assessment and Plan / UC Course  I have reviewed the triage vital signs and the nursing notes.  Pertinent labs & imaging results that were available during my care of the patient were reviewed by me and considered in my medical decision making (see chart for details).     Soft tissue swelling s/p punching injury. No bony injury on xray. Strain vs contusion. Thumb spica placed. Ice, elevation, nsaids. Follow up with sports medicine as needed for persistent symptoms. Patient verbalized understanding and agreeable to plan.   Final Clinical Impressions(s) / UC Diagnoses   Final diagnoses:  Injury of right thumb, initial encounter     Discharge Instructions     Ice, elevation, ibuprofen or aleve as needed to help with swelling.  Brace for comfort and rest, wean out of it as able. Limit activity such as punching until swelling has improved.  I would recommend following up with sports medicine for further evaluation and recommendations.     ED Prescriptions    None     Controlled Substance Prescriptions [place] Controlled Substance Registry consulted? Not Applicable   Zigmund Gottron, NP 09/10/18 2024

## 2018-09-13 ENCOUNTER — Ambulatory Visit: Payer: Self-pay | Admitting: Family Medicine

## 2018-09-13 NOTE — Progress Notes (Deleted)
  Maurice Gonzales - 43 y.o. male MRN [id number]  Date of birth: 01-Sep-1975  SUBJECTIVE:  Including CC & ROS.  No chief complaint on file.   Perrion Diesel is a 43 y.o. male that is  ***.  Independent review of the right hand x-ray from 8/3 shows no acute abnormality.   Review of Systems  HISTORY: Past Medical, Surgical, Social, and Family History Reviewed & Updated per EMR.   Pertinent Historical Findings include:  No past medical history on file.  No past surgical history on file.  No Known Allergies  No family history on file.   Social History   Socioeconomic History  . Marital status: Single    Spouse name: Not on file  . Number of children: Not on file  . Years of education: Not on file  . Highest education level: Not on file  Occupational History  . Not on file  Social Needs  . Financial resource strain: Not on file  . Food insecurity    Worry: Not on file    Inability: Not on file  . Transportation needs    Medical: Not on file    Non-medical: Not on file  Tobacco Use  . Smoking status: Light Tobacco Smoker    Types: Cigars  . Smokeless tobacco: Never Used  . Tobacco comment: smokes cigar every "once in a blue moon"  Substance and Sexual Activity  . Alcohol use: Yes    Comment: Occasiocally   . Drug use: No  . Sexual activity: Not on file  Lifestyle  . Physical activity    Days per week: Not on file    Minutes per session: Not on file  . Stress: Not on file  Relationships  . Social Herbalist on phone: Not on file    Gets together: Not on file    Attends religious service: Not on file    Active member of club or organization: Not on file    Attends meetings of clubs or organizations: Not on file    Relationship status: Not on file  . Intimate partner violence    Fear of current or ex partner: Not on file    Emotionally abused: Not on file    Physically abused: Not on file    Forced sexual activity: Not on file  Other Topics Concern  .  Not on file  Social History Narrative  . Not on file     PHYSICAL EXAM:  VS: There were no vitals taken for this visit. Physical Exam Gen: NAD, alert, cooperative with exam, well-appearing ENT: normal lips, normal nasal mucosa,  Eye: normal EOM, normal conjunctiva and lids CV:  no edema, +2 pedal pulses   Resp: no accessory muscle use, non-labored,  GI: no masses or tenderness, no hernia  Skin: no rashes, no areas of induration  Neuro: normal tone, normal sensation to touch Psych:  normal insight, alert and oriented MSK:  ***      ASSESSMENT & PLAN:   No problem-specific Assessment & Plan notes found for this encounter.

## 2018-10-27 ENCOUNTER — Ambulatory Visit (HOSPITAL_COMMUNITY)
Admission: EM | Admit: 2018-10-27 | Discharge: 2018-10-27 | Disposition: A | Discharge status: Home / Self Care | Payer: Self-pay | Attending: Family Medicine | Admitting: Family Medicine

## 2018-10-27 ENCOUNTER — Other Ambulatory Visit: Payer: Self-pay

## 2018-10-27 ENCOUNTER — Encounter (HOSPITAL_COMMUNITY): Payer: Self-pay

## 2018-10-27 DIAGNOSIS — Z202 Contact with and (suspected) exposure to infections with a predominantly sexual mode of transmission: Secondary | ICD-10-CM

## 2018-10-27 DIAGNOSIS — R369 Urethral discharge, unspecified: Secondary | ICD-10-CM

## 2018-10-27 MED ORDER — CEFTRIAXONE SODIUM 250 MG IJ SOLR
250.0000 mg | Freq: Once | INTRAMUSCULAR | Status: AC
  Administered 2018-10-27: 250 mg via INTRAMUSCULAR

## 2018-10-27 MED ORDER — CEFTRIAXONE SODIUM 250 MG IJ SOLR
INTRAMUSCULAR | Status: AC
  Filled 2018-10-27: qty 250

## 2018-10-27 MED ORDER — AZITHROMYCIN 250 MG PO TABS
1000.0000 mg | ORAL_TABLET | Freq: Once | ORAL | Status: AC
  Administered 2018-10-27: 1000 mg via ORAL

## 2018-10-27 MED ORDER — AZITHROMYCIN 250 MG PO TABS
ORAL_TABLET | ORAL | Status: AC
  Filled 2018-10-27: qty 4

## 2018-10-27 MED ORDER — AZITHROMYCIN 250 MG PO TABS
ORAL_TABLET | ORAL | Status: AC
  Filled 2018-10-27: qty 1

## 2018-10-27 NOTE — Discharge Instructions (Signed)
We will call you with the results from today  Please follow up if your symptoms fail to improve.

## 2018-10-27 NOTE — ED Provider Notes (Signed)
[facility]    CSN: [id number] Arrival date & time: 10/27/18  1344      History   Chief Complaint Chief Complaint  Patient presents with  . SEXUALLY TRANSMITTED DISEASE    HPI Maurice Gonzales is a 43 y.o. male.   He is having penile discharge and urethritis.  He was in a relationship and the other person start developing symptoms.  He has been had ongoing symptoms for the past few days.  Having no fevers or chills.  No suprapubic pain.  Does have discharge and feels like he has increased frequency.  No prior history of STI.    HPI  History reviewed. No pertinent past medical history.  There are no active problems to display for this patient.   History reviewed. No pertinent surgical history.     Home Medications    Prior to Admission medications   Medication Sig Start Date End Date Taking? Authorizing Provider  VYVANSE 40 MG capsule Take 40 mg by mouth daily. 11/22/15  Yes [provider]  ibuprofen (ADVIL,MOTRIN) 600 MG tablet Take 1 tablet (600 mg total) by mouth every 6 (six) hours as needed. 12/12/15   Marlon PelGreene, Tiffany, PA-C  lidocaine (XYLOCAINE) 2 % solution Use as directed 15 mLs in the mouth or throat every 4 (four) hours as needed for mouth pain. May swish and spit 06/18/18   Wieters, Hallie C, PA-C  LORazepam (ATIVAN) 0.5 MG tablet Take 1-2 tablets (0.5-1 mg total) by mouth 3 (three) times daily as needed for anxiety. 12/12/15   Marlon PelGreene, Tiffany, PA-C  omeprazole (PRILOSEC) 20 MG capsule Take 1 capsule (20 mg total) by mouth daily. 10/15/16   Everlene Farrieransie, William, PA-C  tobramycin (TOBREX) 0.3 % ophthalmic solution Place 1 drop into both eyes every 4 (four) hours. 04/08/18   Eustace MooreNelson, Yvonne Sue, MD    Family History Family History  Problem Relation Age of Onset  . Healthy Mother   . Healthy Father     Social History Social History   Tobacco Use  . Smoking status: Light Tobacco Smoker    Types: Cigars  . Smokeless tobacco: Never Used  . Tobacco  comment: smokes cigar every "once in a blue moon"  Substance Use Topics  . Alcohol use: Yes    Comment: Occasiocally   . Drug use: No     Allergies   Patient has no known allergies.   Review of Systems Review of Systems  Constitutional: Negative for fever.  HENT: Negative for congestion.   Respiratory: Negative for cough.   Cardiovascular: Negative for chest pain.  Gastrointestinal: Negative for abdominal pain.  Genitourinary: Positive for discharge.  Musculoskeletal: Negative for back pain.  Skin: Negative for color change.  Neurological: Negative for weakness.  Hematological: Negative for adenopathy.     Physical Exam Triage Vital Signs ED Triage Vitals [10/27/18 1409]  Enc Vitals Group     BP (!) 138/104     Pulse Rate 83     Resp 18     Temp 98.1 F (36.7 C)     Temp src      SpO2 98 %     Weight      Height      Head Circumference      Peak Flow      Pain Score 2     Pain Loc      Pain Edu?      Excl. in GC?    No data found.  Updated Vital  Signs BP (!) 138/104   Pulse 83   Temp 98.1 F (36.7 C)   Resp 18   SpO2 98%   Visual Acuity Right Eye Distance:   Left Eye Distance:   Bilateral Distance:    Right Eye Near:   Left Eye Near:    Bilateral Near:     Physical Exam Gen: NAD, alert, cooperative with exam, well-appearing ENT: normal lips, normal nasal mucosa,  Eye: normal EOM, normal conjunctiva and lids CV:  no edema, +2 pedal pulses   Resp: no accessory muscle use, non-labored,  GI: no masses or tenderness, no hernia GU: discharge with milking of penis, no inguinal lymphadenopathy, Skin: no rashes, no areas of induration  Neuro: normal tone, normal sensation to touch Psych:  normal insight, alert and oriented MSK: Normal gait, normal strength     UC Treatments / Results  Labs (all labs ordered are listed, but only abnormal results are displayed) Labs Reviewed  HIV ANTIBODY (ROUTINE TESTING W REFLEX)  RPR  CYTOLOGY, (ORAL,  ANAL, URETHRAL) ANCILLARY ONLY    EKG   Radiology No results found.  Procedures Procedures (including critical care time)  Medications Ordered in UC Medications  azithromycin (ZITHROMAX) tablet 1,000 mg (1,000 mg Oral Given 10/27/18 1454)  cefTRIAXone (ROCEPHIN) injection 250 mg (250 mg Intramuscular Given 10/27/18 1454)  azithromycin (ZITHROMAX) 250 MG tablet (has no administration in time range)  cefTRIAXone (ROCEPHIN) 250 MG injection (has no administration in time range)  azithromycin (ZITHROMAX) 250 MG tablet (has no administration in time range)    Initial Impression / Assessment and Plan / UC Course  I have reviewed the triage vital signs and the nursing notes.  Pertinent labs & imaging results that were available during my care of the patient were reviewed by me and considered in my medical decision making (see chart for details).     Maurice Gonzales is a 43 year old male that is presenting with discharge.  Partner has been tested and reports some form of infection.  Cytology was taken.  RPR and HIV were collected.  Provided with azithromycin and ceftriaxone in clinic.  Counseled on supportive care and safe sex practices.  Counseled on follow-up and return.  Final Clinical Impressions(s) / UC Diagnoses   Final diagnoses:  Penile discharge  Exposure to STD     Discharge Instructions     We will call you with the results from today  Please follow up if your symptoms fail to improve.     ED Prescriptions    None     PDMP not reviewed this encounter.   Rosemarie Ax, MD 10/27/18 [phone]

## 2018-10-27 NOTE — ED Triage Notes (Signed)
Pt presents for std check. States he has had std symptoms x 3 days.

## 2018-10-28 ENCOUNTER — Other Ambulatory Visit: Payer: Self-pay | Admitting: Family Medicine

## 2018-10-28 LAB — RPR: RPR Ser Ql: NONREACTIVE

## 2018-10-28 LAB — HIV ANTIBODY (ROUTINE TESTING W REFLEX): HIV Screen 4th Generation wRfx: NONREACTIVE

## 2018-10-30 LAB — CYTOLOGY, (ORAL, ANAL, URETHRAL) ANCILLARY ONLY
Chlamydia: NEGATIVE
Neisseria Gonorrhea: POSITIVE — AB
Trichomonas: NEGATIVE

## 2018-10-31 ENCOUNTER — Telehealth (HOSPITAL_COMMUNITY): Payer: Self-pay | Admitting: Emergency Medicine

## 2018-10-31 NOTE — Telephone Encounter (Signed)
Patient contacted and made aware of   cytology results, all questions answered  

## 2018-10-31 NOTE — Telephone Encounter (Signed)
Test for gonorrhea was positive. This was treated at the urgent care visit with IM rocephin 250mg and po zithromax 1g. Pt needs education to refrain from sexual intercourse for 7 days after treatment to give the medicine time to work. Sexual partners need to be notified and tested/treated. Condoms may reduce risk of reinfection. Recheck or followup with PCP for further evaluation if symptoms are not improving. [facility] notified.   Attempted to reach patient. No answer at this time. No voicemail set up.   

## 2018-11-05 IMAGING — CR DG CHEST 2V
2 series · 2 of 2 positions shown · non-contrast
Comparison: Prior radiograph from 10/15/2016.

CLINICAL DATA: Initial evaluation for acute left-sided chest pain.

EXAM:
CHEST - 2 VIEW

[chest pa]
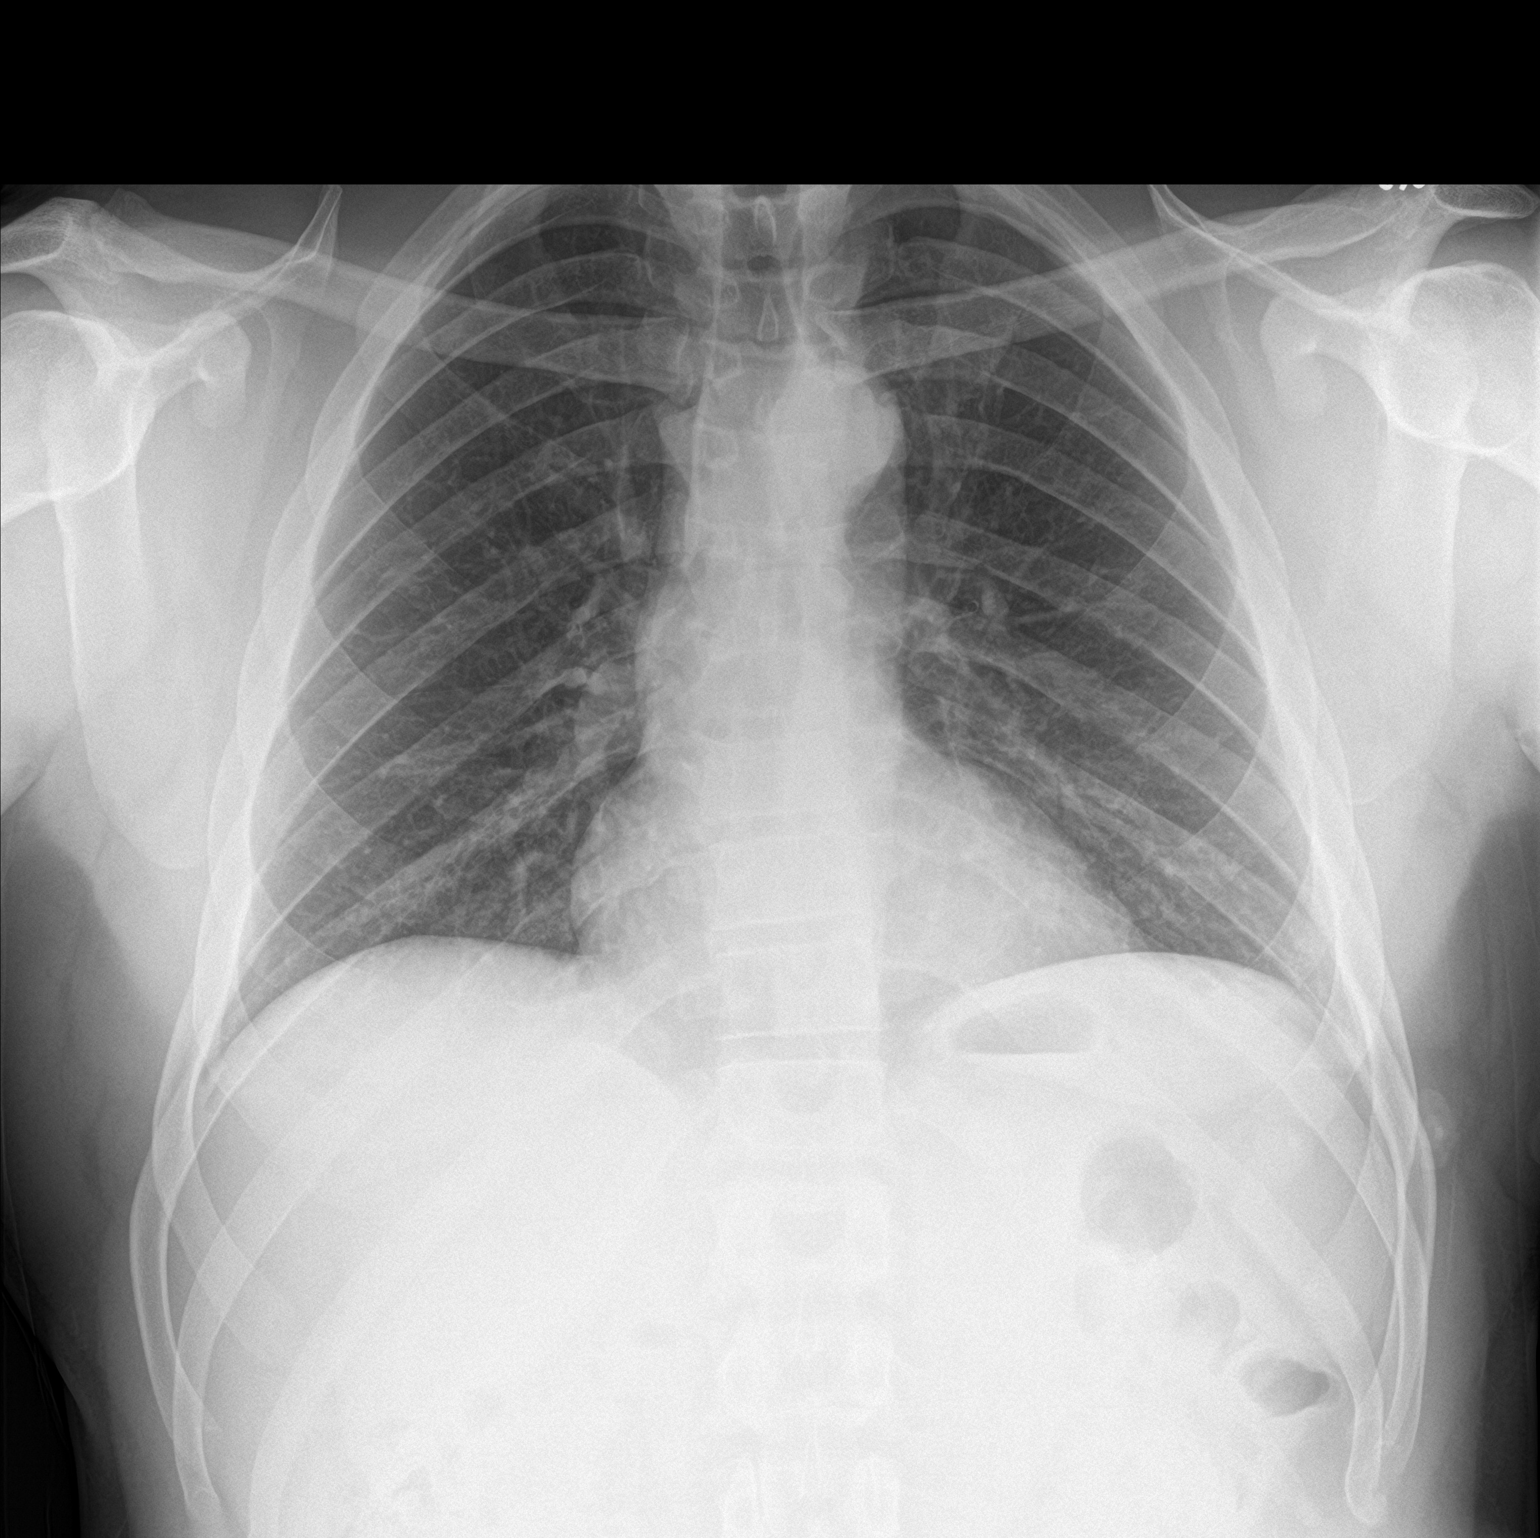

[chest lat]
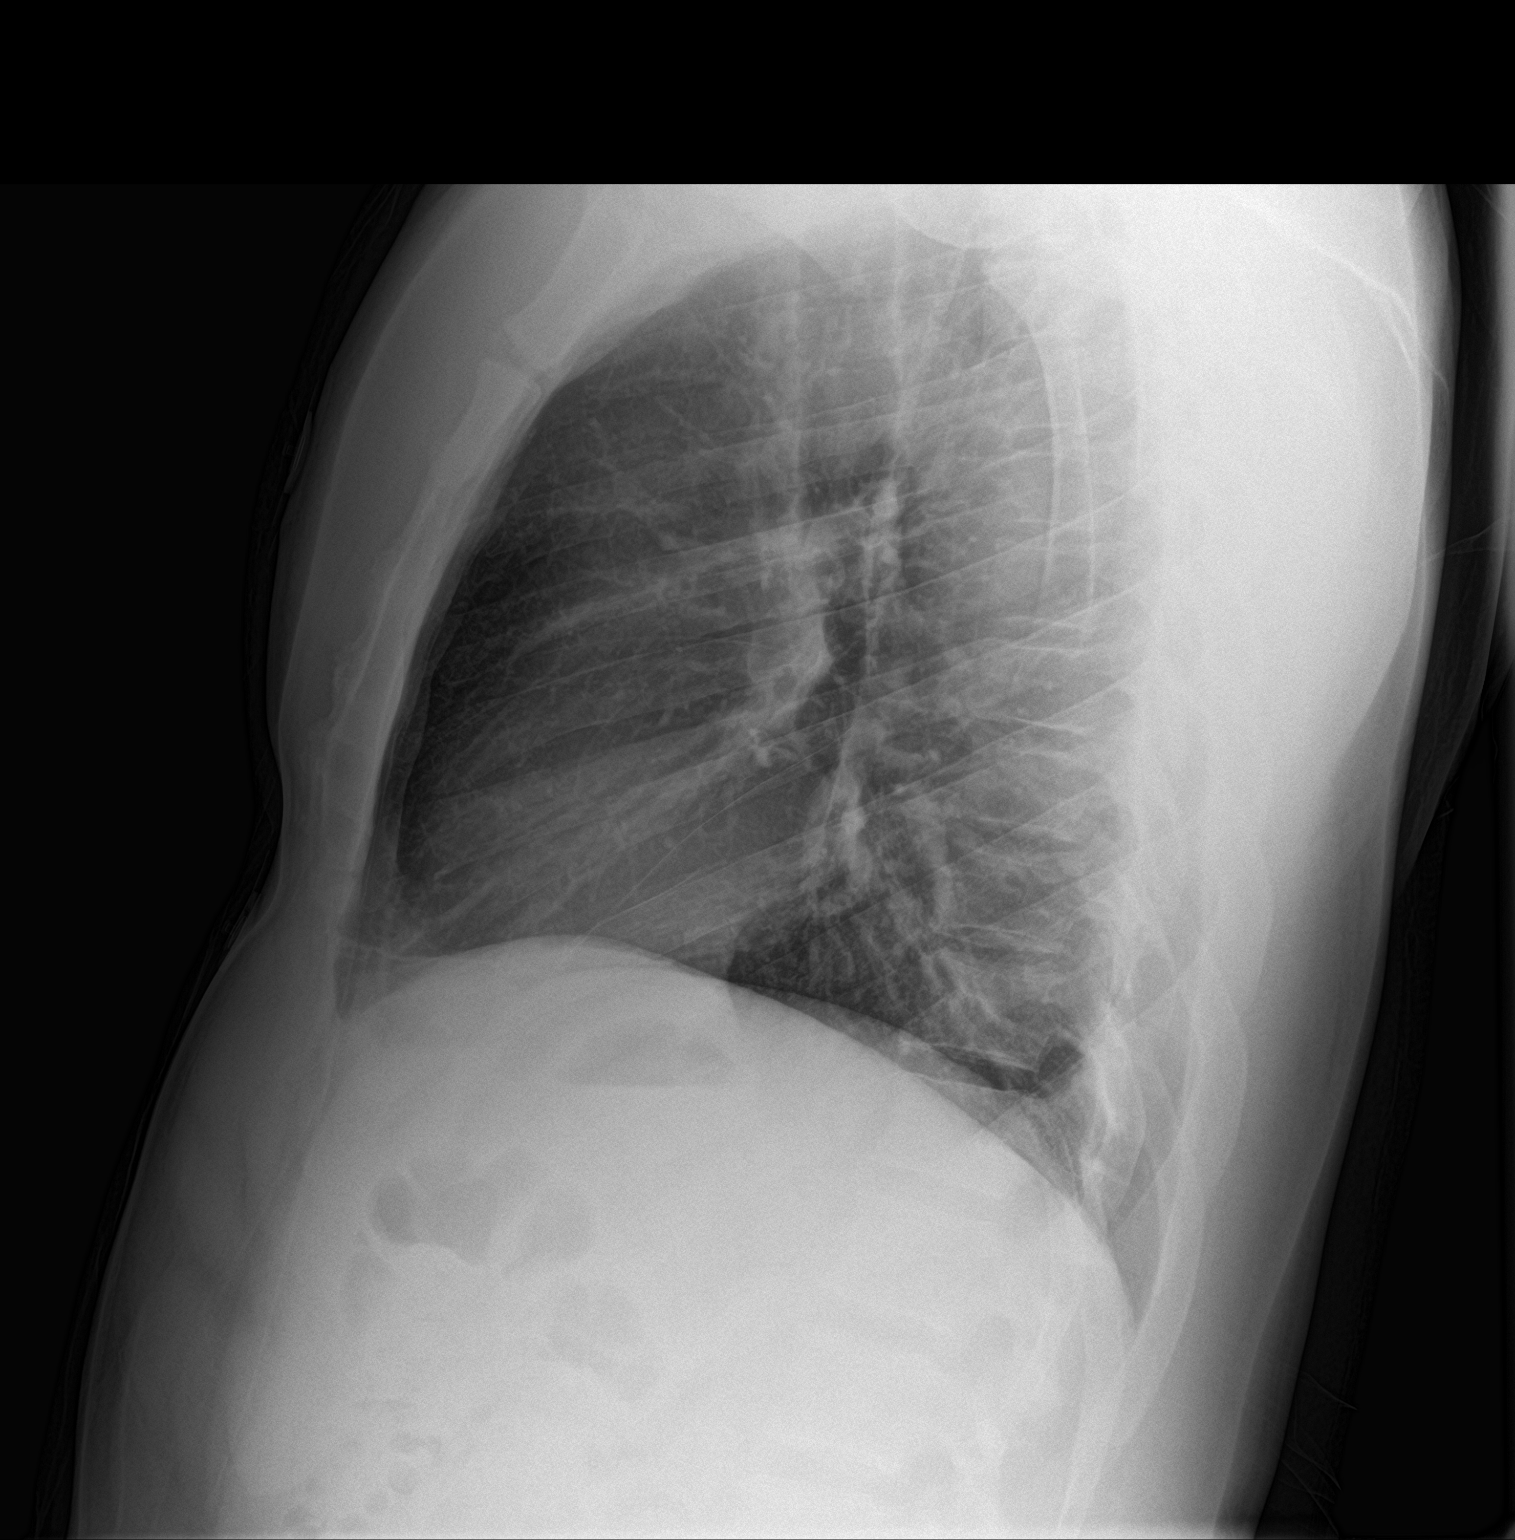

[2 of 2 positions shown; findings below may reference images not displayed]

FINDINGS: The cardiac and mediastinal silhouettes are stable in size and
contour, and remain within normal limits.

The lungs are normally inflated. No airspace consolidation, pleural
effusion, or pulmonary edema is identified. There is no
pneumothorax.

No acute osseous abnormality identified.
IMPRESSION: No active cardiopulmonary disease.

## 2019-06-03 ENCOUNTER — Ambulatory Visit (HOSPITAL_COMMUNITY)
Admission: EM | Admit: 2019-06-03 | Discharge: 2019-06-03 | Disposition: A | Discharge status: Home / Self Care | Payer: PRIVATE HEALTH INSURANCE | Attending: Family Medicine | Admitting: Family Medicine

## 2019-06-03 ENCOUNTER — Other Ambulatory Visit: Payer: Self-pay

## 2019-06-03 DIAGNOSIS — Z202 Contact with and (suspected) exposure to infections with a predominantly sexual mode of transmission: Secondary | ICD-10-CM | POA: Diagnosis not present

## 2019-06-03 MED ORDER — LIDOCAINE HCL (PF) 1 % IJ SOLN
INTRAMUSCULAR | Status: AC
  Filled 2019-06-03: qty 2

## 2019-06-03 MED ORDER — AZITHROMYCIN 250 MG PO TABS
1000.0000 mg | ORAL_TABLET | Freq: Once | ORAL | Status: AC
  Administered 2019-06-03: 1000 mg via ORAL

## 2019-06-03 MED ORDER — METRONIDAZOLE 500 MG PO TABS
ORAL_TABLET | ORAL | Status: AC
  Filled 2019-06-03: qty 4

## 2019-06-03 MED ORDER — CEFTRIAXONE SODIUM 500 MG IJ SOLR
INTRAMUSCULAR | Status: AC
  Filled 2019-06-03: qty 500

## 2019-06-03 MED ORDER — AZITHROMYCIN 250 MG PO TABS
ORAL_TABLET | ORAL | Status: AC
  Filled 2019-06-03: qty 4

## 2019-06-03 MED ORDER — METRONIDAZOLE 500 MG PO TABS
2000.0000 mg | ORAL_TABLET | Freq: Once | ORAL | Status: AC
  Administered 2019-06-03: 2000 mg via ORAL

## 2019-06-03 MED ORDER — CEFTRIAXONE SODIUM 500 MG IJ SOLR
500.0000 mg | Freq: Once | INTRAMUSCULAR | Status: AC
  Administered 2019-06-03: 16:00:00 500 mg via INTRAMUSCULAR

## 2019-06-03 NOTE — ED Triage Notes (Signed)
Pt requesting treatment after sex partner tested positive for STD.

## 2019-06-03 NOTE — ED Provider Notes (Signed)
[facility]    CSN: [id number] Arrival date & time: 06/03/19  1448      History   Chief Complaint Chief Complaint  Patient presents with  . Exposure to STD    HPI Maurice Gonzales is a 44 y.o. male.   Patient is a 44 year old male that presents today for exposure to STD.  Reporting partner informed him that she was positive for gonorrhea, chlamydia and trichomonas.  He is currently denying any symptoms like to be treated based on exposure.     No past medical history on file.  There are no problems to display for this patient.   No past surgical history on file.     Home Medications    Prior to Admission medications   Medication Sig Start Date End Date Taking? Authorizing Provider  ibuprofen (ADVIL,MOTRIN) 600 MG tablet Take 1 tablet (600 mg total) by mouth every 6 (six) hours as needed. 12/12/15   Marlon Pel, PA-C  lidocaine (XYLOCAINE) 2 % solution Use as directed 15 mLs in the mouth or throat every 4 (four) hours as needed for mouth pain. May swish and spit 06/18/18   Wieters, Hallie C, PA-C  LORazepam (ATIVAN) 0.5 MG tablet Take 1-2 tablets (0.5-1 mg total) by mouth 3 (three) times daily as needed for anxiety. 12/12/15   Marlon Pel, PA-C  omeprazole (PRILOSEC) 20 MG capsule Take 1 capsule (20 mg total) by mouth daily. 10/15/16   Everlene Farrier, PA-C  tobramycin (TOBREX) 0.3 % ophthalmic solution Place 1 drop into both eyes every 4 (four) hours. 04/08/18   Eustace Moore, MD  VYVANSE 40 MG capsule Take 40 mg by mouth daily. 11/22/15   [provider]    Family History Family History  Problem Relation Age of Onset  . Healthy Mother   . Healthy Father     Social History Social History   Tobacco Use  . Smoking status: Light Tobacco Smoker    Types: Cigars  . Smokeless tobacco: Never Used  . Tobacco comment: smokes cigar every "once in a blue moon"  Substance Use Topics  . Alcohol use: Yes    Comment: Occasiocally   . Drug use:  No     Allergies   Patient has no known allergies.   Review of Systems Review of Systems   Physical Exam Triage Vital Signs ED Triage Vitals  Enc Vitals Group     BP 06/03/19 1502 125/82     Pulse Rate 06/03/19 1501 83     Resp 06/03/19 1501 16     Temp 06/03/19 1501 98 F (36.7 C)     Temp src --      SpO2 06/03/19 1501 95 %     Weight --      Height --      Head Circumference --      Peak Flow --      Pain Score 06/03/19 1501 0     Pain Loc --      Pain Edu? --      Excl. in GC? --    No data found.  Updated Vital Signs BP 125/82   Pulse 83   Temp 98 F (36.7 C)   Resp 16   SpO2 95%   Visual Acuity Right Eye Distance:   Left Eye Distance:   Bilateral Distance:    Right Eye Near:   Left Eye Near:    Bilateral Near:     Physical Exam Vitals and nursing  note reviewed.  Constitutional:      Appearance: Normal appearance.  HENT:     Head: Normocephalic and atraumatic.     Nose: Nose normal.  Eyes:     Conjunctiva/sclera: Conjunctivae normal.  Pulmonary:     Effort: Pulmonary effort is normal.  Musculoskeletal:        General: Normal range of motion.     Cervical back: Normal range of motion.  Skin:    General: Skin is warm and dry.  Neurological:     Mental Status: He is alert.  Psychiatric:        Mood and Affect: Mood normal.      UC Treatments / Results  Labs (all labs ordered are listed, but only abnormal results are displayed) Labs Reviewed  CYTOLOGY, (ORAL, ANAL, URETHRAL) ANCILLARY ONLY    EKG   Radiology No results found.  Procedures Procedures (including critical care time)  Medications Ordered in UC Medications  metroNIDAZOLE (FLAGYL) tablet 2,000 mg (2,000 mg Oral Given 06/03/19 1530)  azithromycin (ZITHROMAX) tablet 1,000 mg (1,000 mg Oral Given 06/03/19 1531)  cefTRIAXone (ROCEPHIN) injection 500 mg (500 mg Intramuscular Given 06/03/19 1530)    Initial Impression / Assessment and Plan / UC Course  I have  reviewed the triage vital signs and the nursing notes.  Pertinent labs & imaging results that were available during my care of the patient were reviewed by me and considered in my medical decision making (see chart for details).     STD exposure Cytology sent for testing with labs pending.  Treating for gonorrhea, chlamydia and trichomonas based on exposure today. Recommended safe sex practices Follow up as needed for continued or worsening symptoms  Final Clinical Impressions(s) / UC Diagnoses   Final diagnoses:  STD exposure     Discharge Instructions     Treating you for gonorrhea, chlamydia and trichomonas today. Sending swab for testing and will call with any positive results    ED Prescriptions    None     PDMP not reviewed this encounter.   Orvan July, NP 06/03/19 1535

## 2019-06-03 NOTE — Discharge Instructions (Addendum)
Treating you for gonorrhea, chlamydia and trichomonas today. Sending swab for testing and will call with any positive results

## 2019-06-04 LAB — CYTOLOGY, (ORAL, ANAL, URETHRAL) ANCILLARY ONLY
Chlamydia: NEGATIVE
Comment: NEGATIVE
Comment: NEGATIVE
Comment: NORMAL
Neisseria Gonorrhea: NEGATIVE
Trichomonas: NEGATIVE

## 2019-07-12 DIAGNOSIS — U071 COVID-19: Secondary | ICD-10-CM

## 2019-07-12 NOTE — ED Notes (Signed)
Patient states that he does not want to wait and is leaving.

## 2019-07-12 NOTE — ED Notes (Signed)
Patient states that he is covid positive and has SOB and fever.

## 2019-07-12 NOTE — ED Notes (Signed)
Patient states that he does not want to wait and is leaving.

## 2019-07-12 NOTE — ED Notes (Signed)
Patient states that he is covid positive and has SOB and fever.

## 2019-07-13 ENCOUNTER — Emergency Department: Admit: 2019-07-13 | Payer: PRIVATE HEALTH INSURANCE | Primary: Family Medicine

## 2019-07-13 ENCOUNTER — Inpatient Hospital Stay
Admit: 2019-07-13 | Discharge: 2019-07-13 | Disposition: A | Discharge status: Home / Self Care | Payer: PRIVATE HEALTH INSURANCE | Attending: Emergency Medicine

## 2019-07-13 LAB — CBC WITH AUTO DIFFERENTIAL
Basophils %: 0 % (ref 0–3)
Eosinophils %: 0 % (ref 0–5)
Hematocrit: 42.3 % (ref 37.0–50.0)
Hemoglobin: 14.8 gm/dl (ref 12.4–17.2)
Immature Granulocytes: 0.3 % (ref 0.0–3.0)
Lymphocytes %: 20.3 % — ABNORMAL LOW (ref 28–48)
MCH: 28.6 pg (ref 23.0–34.6)
MCHC: 35 gm/dl (ref 30.0–36.0)
MCV: 81.7 fL (ref 80.0–98.0)
MPV: 10.7 fL — ABNORMAL HIGH (ref 6.0–10.0)
Monocytes %: 4.4 % (ref 1–13)
Neutrophils %: 75 % — ABNORMAL HIGH (ref 34–64)
Nucleated RBCs: 0 (ref 0–0)
Platelets: 77 10*3/uL — ABNORMAL LOW (ref 140–450)
RBC: 5.18 M/uL (ref 3.80–5.70)
RDW-SD: 36.9 (ref 35.1–43.9)
WBC: 3.2 10*3/uL — ABNORMAL LOW (ref 4.0–11.0)

## 2019-07-13 LAB — EKG 12-LEAD
Atrial Rate: 82 {beats}/min
Diagnosis: NORMAL
P Axis: 58 degrees
P-R Interval: 146 ms
Q-T Interval: 350 ms
QRS Duration: 82 ms
QTc Calculation (Bazett): 408 ms
R Axis: -45 degrees
T Axis: 20 degrees
Ventricular Rate: 82 {beats}/min

## 2019-07-13 LAB — BASIC METABOLIC PANEL
Anion Gap: 6 mmol/L (ref 5–15)
BUN: 12 mg/dl (ref 7–25)
CO2: 27 mEq/L (ref 21–32)
Calcium: 8.7 mg/dl (ref 8.5–10.1)
Chloride: 101 mEq/L (ref 98–107)
Creatinine: 1.2 mg/dl (ref 0.6–1.3)
EGFR IF NonAfrican American: >60
GFR African American: >60
Glucose: 95 mg/dl (ref 74–106)
Potassium: 3.9 mEq/L (ref 3.5–5.1)
Sodium: 134 mEq/L — ABNORMAL LOW (ref 136–145)

## 2019-07-13 LAB — D-DIMER, QUANTITATIVE: D-Dimer, Quant: 2.53 ug/mL (FEU) — ABNORMAL HIGH (ref 0.01–0.50)

## 2019-07-13 LAB — CBC WITH AUTOMATED DIFF
BASOPHILS: 0 % (ref 0–3)
EOSINOPHILS: 0 % (ref 0–5)
HCT: 42.3 % (ref 37.0–50.0)
HGB: 14.8 gm/dl (ref 12.4–17.2)
IMMATURE GRANULOCYTES: 0.3 % (ref 0.0–3.0)
LYMPHOCYTES: 20.3 % — ABNORMAL LOW (ref 28–48)
MCH: 28.6 pg (ref 23.0–34.6)
MCHC: 35 gm/dl (ref 30.0–36.0)
MCV: 81.7 fL (ref 80.0–98.0)
MONOCYTES: 4.4 % (ref 1–13)
MPV: 10.7 fL — ABNORMAL HIGH (ref 6.0–10.0)
NEUTROPHILS: 75 % — ABNORMAL HIGH (ref 34–64)
NRBC: 0 (ref 0–0)
PLATELET: 77 10*3/uL — ABNORMAL LOW (ref 140–450)
RBC: 5.18 M/uL (ref 3.80–5.70)
RDW-SD: 36.9 (ref 35.1–43.9)
WBC: 3.2 10*3/uL — ABNORMAL LOW (ref 4.0–11.0)

## 2019-07-13 LAB — METABOLIC PANEL, BASIC
Anion gap: 6 mmol/L (ref 5–15)
BUN: 12 mg/dl (ref 7–25)
CO2: 27 mEq/L (ref 21–32)
Calcium: 8.7 mg/dl (ref 8.5–10.1)
Chloride: 101 mEq/L (ref 98–107)
Creatinine: 1.2 mg/dl (ref 0.6–1.3)
GFR est AA: >60
GFR est non-AA: >60
Glucose: 95 mg/dl (ref 74–106)
Potassium: 3.9 mEq/L (ref 3.5–5.1)
Sodium: 134 mEq/L — ABNORMAL LOW (ref 136–145)

## 2019-07-13 LAB — EKG, 12 LEAD, INITIAL
Atrial Rate: 82 {beats}/min
Calculated P Axis: 58 degrees
Calculated R Axis: -45 degrees
Calculated T Axis: 20 degrees
Diagnosis: NORMAL
P-R Interval: 146 ms
Q-T Interval: 350 ms
QRS Duration: 82 ms
QTC Calculation (Bezet): 408 ms
Ventricular Rate: 82 {beats}/min

## 2019-07-13 LAB — D DIMER: D DIMER: 2.53 ug/mL (FEU) — ABNORMAL HIGH (ref 0.01–0.50)

## 2019-07-13 MED ORDER — SODIUM CHLORIDE 0.9 % IJ SYRG
Freq: Once | INTRAMUSCULAR | Status: AC
Start: 2019-07-13 — End: 2019-07-13
  Administered 2019-07-13: 06:00:00 via INTRAVENOUS

## 2019-07-13 MED ORDER — IBUPROFEN 600 MG TAB
600 mg | ORAL | Status: AC
Start: 2019-07-13 — End: 2019-07-13
  Administered 2019-07-13: 06:00:00 via ORAL

## 2019-07-13 MED ORDER — IOPAMIDOL 61 % IV SOLN
61 % | Freq: Once | INTRAVENOUS | Status: AC
Start: 2019-07-13 — End: 2019-07-13
  Administered 2019-07-13: 07:00:00 via INTRAVENOUS

## 2019-07-13 MED ORDER — SODIUM CHLORIDE 0.9 % IJ SYRG
Freq: Once | INTRAMUSCULAR | Status: AC
Start: 2019-07-13 — End: 2019-07-13
  Administered 2019-07-13: 07:00:00 via INTRAVENOUS

## 2019-07-13 MED ORDER — SODIUM CHLORIDE 0.9% BOLUS IV
0.9 % | INTRAVENOUS | Status: AC
Start: 2019-07-13 — End: 2019-07-13
  Administered 2019-07-13: 06:00:00 via INTRAVENOUS

## 2019-07-13 MED FILL — ISOVUE-300  61 % INTRAVENOUS SOLUTION: 300 mg iodine /mL (61 %) | INTRAVENOUS | Qty: 100

## 2019-07-13 MED FILL — IBUPROFEN 600 MG TAB: 600 mg | ORAL | Qty: 1

## 2019-07-13 NOTE — ED Provider Notes (Signed)
ED Provider Notes by Susanne BordersPervere, Joyell Emami V, PA-C at 07/13/19 0122                Author: Susanne BordersPervere, Velvie Thomaston V, PA-C  Service: EMERGENCY  Author Type: Physician Assistant       Filed: 07/13/19 0639  Date of Service: 07/13/19 0122  Status: Attested           Editor: Silver HugueninPervere, Alasdair Kleve V, PA-C (Physician Assistant)  Cosigner: Smitty CordsKisa, Erik H, MD at 07/13/19 [phone]          Attestation signed by Smitty CordsKisa, Erik H, MD at 07/13/19 [phone]          I have discussed this case with the advanced practice provider. We have reviewed the presentation, pertinent historical and physical exam findings, as well  as relevant  findings. I have been available at all times and agree with the management and disposition documented here.      Smitty CordsErik H Kisa, MD   July 13, 2019                                    [facility]   Emergency Department Treatment Report                Patient: Anthony Ryan  Age: 44 y.o.  Sex: male          Date of Birth: October 05, 1975  Admit Date: 07/13/2019  PCP: Shanon Payorooper, Stewart, MD     MRN: [id number]   CSN: [id number]   MD Smitty CordsKISA, ERIK H, MD         Room: ER03/ER03  Time Dictated: 1:22 AM  APP: Susanne BordersMelissa Sharnise Blough V, PA-C            Chief Complaint    Covid, sob, chest pain        History of Present Illness     44 y.o. male  without pertinent medical history, presents with chief complaint of shortness of breath. States he was diagnosed with covid on Monday, began having symptoms last Saturday, but tonight, he states that every time he takes a deep breath, he has a deep stabbing  pain in the center of his chest that makes it hard to breathe, so he takes shallow breaths, and feels short of breath.    + nausea and diarrhea x 3- times today.    Endorses fevers.       Took tylenol 1 hours ago.       Denies sore throat abdominal pain, vomiting, dysuria, neck pain.         Review of Systems     Review of Systems    Constitutional: Positive for chills, fever  and malaise/fatigue.    HENT: Positive for congestion. Negative  for ear pain and sore throat.     Eyes: Negative for blurred vision.    Respiratory: Positive for cough and shortness of breath . Negative for hemoptysis and sputum production.     Cardiovascular:         Pleuritic chest pain    Gastrointestinal: Positive for diarrhea and nausea . Negative for abdominal pain, blood in stool, melena and vomiting.    Genitourinary: Negative for dysuria.    Musculoskeletal: Positive for myalgias.    Skin: Negative for rash.    Neurological: Negative for loss of consciousness.    Endo/Heme/Allergies: Does not bruise/bleed easily.    All other systems reviewed and  are negative.           Past Medical/Surgical History     No past medical history on file.   No past surgical history on file.        Social History          Social History          Socioeconomic History         ?  Marital status:  SINGLE              Spouse name:  Not on file         ?  Number of children:  Not on file     ?  Years of education:  Not on file     ?  Highest education level:  Not on file       Occupational History        ?  Not on file       Tobacco Use         ?  Smoking status:  Not on file       Substance and Sexual Activity         ?  Alcohol use:  Not on file     ?  Drug use:  Not on file     ?  Sexual activity:  Not on file        Other Topics  Concern        ?  Not on file       Social History Narrative        ?  Not on file          Social Determinants of Health          Financial Resource Strain:         ?  Difficulty of Paying Living Expenses:        Food Insecurity:         ?  Worried About Programme researcher, broadcasting/film/video in the Last Year:      ?  Barista in the Last Year:        Transportation Needs:         ?  Freight forwarder (Medical):      ?  Lack of Transportation (Non-Medical):        Physical Activity:         ?  Days of Exercise per Week:      ?  Minutes of Exercise per Session:        Stress:         ?  Feeling of Stress :        Social Connections:         ?  Frequency of Communication  with Friends and Family:      ?  Frequency of Social Gatherings with Friends and Family:      ?  Attends Religious Services:      ?  Active Member of Clubs or Organizations:      ?  Attends Banker Meetings:      ?  Marital Status:        Intimate Partner Violence:         ?  Fear of Current or Ex-Partner:      ?  Emotionally Abused:      ?  Physically Abused:         ?  Sexually Abused:  Family History     No family history on file.        Current Medications             Allergies     No Known Allergies        Physical Exam          ED Triage Vitals [07/12/19 2148]     ED Encounter Vitals Group           BP  139/84        Pulse (Heart Rate)  88        Resp Rate  18        Temp  99.5 ??F (37.5 ??C)        Temp src          O2 Sat (%)  99 %        Weight  225 lb           Height  5\' 10"         Physical Exam   Vitals and nursing note reviewed.   Constitutional:        General: He is not in acute distress.     Appearance: He is not ill-appearing or toxic-appearing.    HENT:       Head: Normocephalic.      Nose: Nose normal.      Mouth/Throat:      Mouth: Mucous membranes are moist.    Eyes:       Pupils: Pupils are equal, round, and reactive to light.   Cardiovascular:       Rate and Rhythm: Normal rate and regular rhythm.      Pulses: Normal pulses.    Pulmonary:       Effort: Pulmonary effort is normal.      Breath sounds: No wheezing, rhonchi or rales.   Abdominal:      General: Bowel sounds are normal.  There is no distension.      Palpations: Abdomen is soft.     Musculoskeletal:          General: Normal range of motion.      Cervical back: Normal range of motion. No rigidity.      Right lower leg: No edema.      Left lower leg: No edema.    Skin:      General: Skin is warm and dry.      Capillary Refill: Capillary refill takes less than 2 seconds.    Neurological:       General: No focal deficit present.      Mental Status: He is oriented to person, place, and time.               Impression  and Management Plan     44 year old male presenting with shortness of breath and pleuritic chest pain who is positive for Covid.  Will obtain basic labs, D-dimer, EKG, disposition pending result.  Consider  pneumonia, pulmonary embolus, less likely ACS, sequela of Covid.        Diagnostic Studies     Lab:      Recent Results (from the past 12 hour(s))     CBC WITH AUTOMATED DIFF          Collection Time: 07/13/19  2:09 AM         Result  Value  Ref Range            WBC  3.2 (L)  4.0 -  11.0 1000/mm3       RBC  5.18  3.80 - 5.70 M/uL       HGB  14.8  12.4 - 17.2 gm/dl       HCT  42.3  37.0 - 50.0 %       MCV  81.7  80.0 - 98.0 fL       MCH  28.6  23.0 - 34.6 pg       MCHC  35.0  30.0 - 36.0 gm/dl       PLATELET  77 (L)  140 - 450 1000/mm3       MPV  10.7 (H)  6.0 - 10.0 fL       RDW-SD  36.9  35.1 - 43.9         NRBC  0  0 - 0         IMMATURE GRANULOCYTES  0.3  0.0 - 3.0 %       NEUTROPHILS  75.0 (H)  34 - 64 %       LYMPHOCYTES  20.3 (L)  28 - 48 %       MONOCYTES  4.4  1 - 13 %       EOSINOPHILS  0.0  0 - 5 %       BASOPHILS  0.0  0 - 3 %       METABOLIC PANEL, BASIC          Collection Time: 07/13/19  2:09 AM         Result  Value  Ref Range            Sodium  134 (L)  136 - 145 mEq/L       Potassium  3.9  3.5 - 5.1 mEq/L       Chloride  101  98 - 107 mEq/L       CO2  27  21 - 32 mEq/L       Glucose  95  74 - 106 mg/dl       BUN  12  7 - 25 mg/dl       Creatinine  1.2  0.6 - 1.3 mg/dl       GFR est AA  >60          GFR est non-AA  >60          Calcium  8.7  8.5 - 10.1 mg/dl       Anion gap  6  5 - 15 mmol/L       D DIMER          Collection Time: 07/13/19  2:09 AM         Result  Value  Ref Range            D DIMER  2.53 (H)  0.01 - 0.50 ug/mL (FEU)          Labs Reviewed       CBC WITH AUTOMATED DIFF - Abnormal; Notable for the following components:            Result  Value            WBC  3.2 (*)         PLATELET  77 (*)         MPV  10.7 (*)         NEUTROPHILS  75.0 (*)         LYMPHOCYTES  20.3 (*)  All other components within normal limits       METABOLIC PANEL, BASIC - Abnormal; Notable for the following components:            Sodium  134 (*)            All other components within normal limits       D DIMER - Abnormal; Notable for the following components:            D DIMER  2.53 (*)            All other components within normal limits           Imaging:     XR CHEST SNGL V      Result Date: 07/13/2019   Clinical history: Cough, shortness of breath EXAMINATION: Single view of the chest 07/13/2019 FINDINGS: Trachea and heart size are within normal limits. Lungs are clear.       IMPRESSION: No acute pulmonary process.       Pt's chest XR as interpreted by Dr. Arvella Merles and myself shows no infiltrate no pneumothorax.          Report Submission Date: Jul 13, 2019 3:41:41 AM EDT             Patient Name: Anthony Ryan, Anthony Ryan Initiated: Jul 13, 2019 2:52:07 AM EDT    DOB: [birth year]    Study Received: Jul 13, 2019 3:15:22 AM EDT    MRN: [id number]   Modality Type: CT    Gender: M    Description: Elenora Fender (ADULT)    Accession #: [id number]   Body Part: abdomen     Institution: [facility]      Physician: Divine Hansley V               History:  covid, pleuritic pain, elevated. (Hx)                 --------------------------------------------------------------------------------   PRELIMINARY RADIOLOGY REPORT                   EXAM: CTA Chest with Intravenous Contrast for PE evaluation      CLINICAL HISTORY: covid, pleuritic pain, elevated.       TECHNIQUE: Axial CTA images of the chest with intravenous contrast using a pulmonary embolism protocol. Multiplanar reconstructed images were created and reviewed.      CONTRAST: was administered without incident.      COMPARISON: None provided.        FINDINGS:       PULMONARY ARTERIES: No evidence of central or segmental pulmonary embolism is seen.      AORTA: There is no evidence for aneurysm or dissection of the thoracic aorta.      LUNGS:  Prominent patchy bilaterally regions of pulmonary ground glass opacity and consolidation are consistent with multifocal pneumonia.       PLEURAL SPACES: No evidence of pneumothorax. No pleural effusion.      HEART: Normal heart size. No significant pericardial effusion.      LYMPH NODES: No lymphadenopathy is evident.      BONES: No focal osseous abnormality or acute fracture.      UPPER ABDOMEN: Images of the upper abdomen are unremarkable.      IMPRESSION:      1. Negative PE study.   2. Prominent patchy bilaterally regions of pulmonary ground glass opacity and consolidation are consistent with multifocal pneumonia.  Electronically signed on Jul 13, 2019 3:41:41 AM EDT by:      Urbano Heir MD, Ph.D.      Diplomate, Biomedical engineer of Radiology         Pt's EKG as interpreted by Dr. Arvella Merles  Shows sinus rhythm with a ventricular rate of 82 bpm, PR interval of 146 ms, QRS duration of 82 ms, QT/QTc is 350/4 8 ms, no acute ischemia or STEMI.     ED Course               Medications       sodium chloride (NS) flush 5-10 mL (10 mL IntraVENous Given 07/13/19 0229)     sodium chloride 0.9 % bolus infusion 1,000 mL (0 mL IntraVENous IV Completed 07/13/19 0422)     ibuprofen (MOTRIN) tablet 600 mg (600 mg Oral Given 07/13/19 0228)     iopamidoL (ISOVUE 300) 61 % contrast injection 100 mL (100 mL IntraVENous Given 07/13/19 0300)       sodium chloride (NS) flush 5-10 mL (10 mL IntraVENous Given 07/13/19 0300)          Medical Decision Making     44 year old male who presents with shortness of breath and pleuritic pain who is Covid positive.  Vital signs stable, he is never hypoxic, or tachycardic, he has stable labs however  his D-dimer was elevated and so a CTA of the chest PE protocol was obtained to rule out pulmonary embolus.  None such was found however he does have groundglass opacities bilaterally is consistent with multifocal Covid pneumonia.  At this time he does  not meet admission requirements nor antibody infusion  guidelines, and I believe that he is stable for outpatient management.  I gave him strict return precautions which she verbalized understanding and was discharged in hemodynamically stable condition  to home to follow-up with his primary care provider.        Final Diagnosis                 ICD-10-CM  ICD-9-CM          1.  Pneumonia due to COVID-19 virus   U07.1  480.8           J12.82  079.89          2.  Pleuritic pain   R07.81  786.52             Disposition     Discharged to home         The patient was personally evaluated by myself and discussed with Dr. Arvella Merles, Wetzel Bjornstad, MD who agrees with the above assessment and plan.   Brett Canales, PA-C   July 13, 2019      My signature above authenticates this document and my orders, the final     diagnosis (es), discharge prescription (s), and instructions in the Epic     record.   If you have any questions please contact [phone].       Nursing notes have been reviewed by the physician/ advanced practice     Clinician.      Dragon medical dictation software was used for portions of this report. Unintended voice recognition errors may occur.

## 2019-07-13 NOTE — ED Notes (Signed)
4:41 AM  07/13/19     Discharge instructions given to patient (name) with verbalization of understanding. Patient accompanied by self.  Patient discharged with the following prescriptions none. Patient discharged to home (destination).      Soo-Yin Dunn

## 2019-07-13 NOTE — ED Notes (Signed)
Patient returned back to triage now stating that he is having chest pain.

## 2019-07-13 NOTE — ED Notes (Signed)
4:41 AM  07/13/19     Discharge instructions given to patient (name) with verbalization of understanding. Patient accompanied by self.  Patient discharged with the following prescriptions none. Patient discharged to home (destination).      Anthony Ryan

## 2019-07-13 NOTE — ED Notes (Signed)
Patient returned back to triage now stating that he is having chest pain.

## 2019-07-13 NOTE — ED Provider Notes (Signed)
[facility]  Emergency Department Treatment Report        Patient: Anthony Ryan Age: 44 y.o. Sex: male    Date of Birth: 12-19-1975 Admit Date: 07/13/2019 PCP: Shanon Payor, MD   MRN: [id number]  CSN: [id number]  MD Smitty Cords, MD   Room: ER03/ER03 Time Dictated: 1:22 AM APP: Susanne Borders, PA-C        Chief Complaint   Covid, sob, chest pain    History of Present Illness   44 y.o. male without pertinent medical history, presents with chief complaint of shortness of breath. States he was diagnosed with covid on Monday, began having symptoms last Saturday, but tonight, he states that every time he takes a deep breath, he has a deep stabbing pain in the center of his chest that makes it hard to breathe, so he takes shallow breaths, and feels short of breath.   + nausea and diarrhea x 3- times today.   Endorses fevers.     Took tylenol 1 hours ago.     Denies sore throat abdominal pain, vomiting, dysuria, neck pain.     Review of Systems   Review of Systems   Constitutional: Positive for chills, fever and malaise/fatigue.   HENT: Positive for congestion. Negative for ear pain and sore throat.    Eyes: Negative for blurred vision.   Respiratory: Positive for cough and shortness of breath. Negative for hemoptysis and sputum production.    Cardiovascular:        Pleuritic chest pain   Gastrointestinal: Positive for diarrhea and nausea. Negative for abdominal pain, blood in stool, melena and vomiting.   Genitourinary: Negative for dysuria.   Musculoskeletal: Positive for myalgias.   Skin: Negative for rash.   Neurological: Negative for loss of consciousness.   Endo/Heme/Allergies: Does not bruise/bleed easily.   All other systems reviewed and are negative.      Past Medical/Surgical History   No past medical history on file.  No past surgical history on file.    Social History     Social History     Socioeconomic History   ??? Marital status: SINGLE     Spouse name: Not on file   ??? Number of  children: Not on file   ??? Years of education: Not on file   ??? Highest education level: Not on file   Occupational History   ??? Not on file   Tobacco Use   ??? Smoking status: Not on file   Substance and Sexual Activity   ??? Alcohol use: Not on file   ??? Drug use: Not on file   ??? Sexual activity: Not on file   Other Topics Concern   ??? Not on file   Social History Narrative   ??? Not on file     Social Determinants of Health     Financial Resource Strain:    ??? Difficulty of Paying Living Expenses:    Food Insecurity:    ??? Worried About Programme researcher, broadcasting/film/video in the Last Year:    ??? Barista in the Last Year:    Transportation Needs:    ??? Freight forwarder (Medical):    ??? Lack of Transportation (Non-Medical):    Physical Activity:    ??? Days of Exercise per Week:    ??? Minutes of Exercise per Session:    Stress:    ??? Feeling of Stress :    Social Connections:    ???  Frequency of Communication with Friends and Family:    ??? Frequency of Social Gatherings with Friends and Family:    ??? Attends Religious Services:    ??? Database administrator or Organizations:    ??? Attends Engineer, structural:    ??? Marital Status:    Intimate Programme researcher, broadcasting/film/video Violence:    ??? Fear of Current or Ex-Partner:    ??? Emotionally Abused:    ??? Physically Abused:    ??? Sexually Abused:        Family History   No family history on file.    Current Medications       Allergies   No Known Allergies    Physical Exam     ED Triage Vitals [07/12/19 2148]   ED Encounter Vitals Group      BP 139/84      Pulse (Heart Rate) 88      Resp Rate 18      Temp 99.5 ??F (37.5 ??C)      Temp src       O2 Sat (%) 99 %      Weight 225 lb      Height 5\' 10"      Physical Exam  Vitals and nursing note reviewed.   Constitutional:       General: He is not in acute distress.     Appearance: He is not ill-appearing or toxic-appearing.   HENT:      Head: Normocephalic.      Nose: Nose normal.      Mouth/Throat:      Mouth: Mucous membranes are moist.   Eyes:      Pupils: Pupils are  equal, round, and reactive to light.   Cardiovascular:      Rate and Rhythm: Normal rate and regular rhythm.      Pulses: Normal pulses.   Pulmonary:      Effort: Pulmonary effort is normal.      Breath sounds: No wheezing, rhonchi or rales.   Abdominal:      General: Bowel sounds are normal. There is no distension.      Palpations: Abdomen is soft.   Musculoskeletal:         General: Normal range of motion.      Cervical back: Normal range of motion. No rigidity.      Right lower leg: No edema.      Left lower leg: No edema.   Skin:     General: Skin is warm and dry.      Capillary Refill: Capillary refill takes less than 2 seconds.   Neurological:      General: No focal deficit present.      Mental Status: He is oriented to person, place, and time.          Impression and Management Plan   44 year old male presenting with shortness of breath and pleuritic chest pain who is positive for Covid.  Will obtain basic labs, D-dimer, EKG, disposition pending result.  Consider pneumonia, pulmonary embolus, less likely ACS, sequela of Covid.    Diagnostic Studies   Lab:   Recent Results (from the past 12 hour(s))   CBC WITH AUTOMATED DIFF    Collection Time: 07/13/19  2:09 AM   Result Value Ref Range    WBC 3.2 (L) 4.0 - 11.0 1000/mm3    RBC 5.18 3.80 - 5.70 M/uL    HGB 14.8 12.4 - 17.2 gm/dl    HCT 09/12/19 67.6 -  50.0 %    MCV 81.7 80.0 - 98.0 fL    MCH 28.6 23.0 - 34.6 pg    MCHC 35.0 30.0 - 36.0 gm/dl    PLATELET 77 (L) 161 - 450 1000/mm3    MPV 10.7 (H) 6.0 - 10.0 fL    RDW-SD 36.9 35.1 - 43.9      NRBC 0 0 - 0      IMMATURE GRANULOCYTES 0.3 0.0 - 3.0 %    NEUTROPHILS 75.0 (H) 34 - 64 %    LYMPHOCYTES 20.3 (L) 28 - 48 %    MONOCYTES 4.4 1 - 13 %    EOSINOPHILS 0.0 0 - 5 %    BASOPHILS 0.0 0 - 3 %   METABOLIC PANEL, BASIC    Collection Time: 07/13/19  2:09 AM   Result Value Ref Range    Sodium 134 (L) 136 - 145 mEq/L    Potassium 3.9 3.5 - 5.1 mEq/L    Chloride 101 98 - 107 mEq/L    CO2 27 21 - 32 mEq/L    Glucose 95 74 -  106 mg/dl    BUN 12 7 - 25 mg/dl    Creatinine 1.2 0.6 - 1.3 mg/dl    GFR est AA >09      GFR est non-AA >60      Calcium 8.7 8.5 - 10.1 mg/dl    Anion gap 6 5 - 15 mmol/L   D DIMER    Collection Time: 07/13/19  2:09 AM   Result Value Ref Range    D DIMER 2.53 (H) 0.01 - 0.50 ug/mL (FEU)     Labs Reviewed   CBC WITH AUTOMATED DIFF - Abnormal; Notable for the following components:       Result Value    WBC 3.2 (*)     PLATELET 77 (*)     MPV 10.7 (*)     NEUTROPHILS 75.0 (*)     LYMPHOCYTES 20.3 (*)     All other components within normal limits   METABOLIC PANEL, BASIC - Abnormal; Notable for the following components:    Sodium 134 (*)     All other components within normal limits   D DIMER - Abnormal; Notable for the following components:    D DIMER 2.53 (*)     All other components within normal limits       Imaging:    XR CHEST SNGL V    Result Date: 07/13/2019  Clinical history: Cough, shortness of breath EXAMINATION: Single view of the chest 07/13/2019 FINDINGS: Trachea and heart size are within normal limits. Lungs are clear.     IMPRESSION: No acute pulmonary process.     Pt's chest XR as interpreted by Dr. Arvella Merles and myself shows no infiltrate no pneumothorax.       Report Submission Date: Jul 13, 2019 3:41:41 AM EDT         Patient Name: Anthony Ryan, Anthony Ryan Initiated: Jul 13, 2019 2:52:07 AM EDT   DOB: Oct 14, 1975    Study Received: Jul 13, 2019 3:15:22 AM EDT   MRN: [id number]   Modality Type: CT   Gender: M    Description: Elenora Fender (ADULT)   Accession #: [id number]   Body Part: abdomen    Institution: [facility]     Physician: Rudie Sermons V           History:  covid, pleuritic pain, elevated. (Hx)            --------------------------------------------------------------------------------  PRELIMINARY RADIOLOGY REPORT             EXAM: CTA Chest with Intravenous Contrast for PE evaluation    CLINICAL HISTORY: covid, pleuritic pain, elevated.     TECHNIQUE: Axial CTA images of  the chest with intravenous contrast using a pulmonary embolism protocol. Multiplanar reconstructed images were created and reviewed.    CONTRAST: was administered without incident.    COMPARISON: None provided.      FINDINGS:     PULMONARY ARTERIES: No evidence of central or segmental pulmonary embolism is seen.    AORTA: There is no evidence for aneurysm or dissection of the thoracic aorta.    LUNGS: Prominent patchy bilaterally regions of pulmonary ground glass opacity and consolidation are consistent with multifocal pneumonia.     PLEURAL SPACES: No evidence of pneumothorax. No pleural effusion.    HEART: Normal heart size. No significant pericardial effusion.    LYMPH NODES: No lymphadenopathy is evident.    BONES: No focal osseous abnormality or acute fracture.    UPPER ABDOMEN: Images of the upper abdomen are unremarkable.    IMPRESSION:    1. Negative PE study.  2. Prominent patchy bilaterally regions of pulmonary ground glass opacity and consolidation are consistent with multifocal pneumonia.            Electronically signed on Jul 13, 2019 3:41:41 AM EDT by:    Cherre Huger MD, Ph.D.    Diplomate, Tax adviser of Radiology      Pt's EKG as interpreted by Dr. Alisa Graff  Shows sinus rhythm with a ventricular rate of 82 bpm, PR interval of 146 ms, QRS duration of 82 ms, QT/QTc is 350/4 8 ms, no acute ischemia or STEMI.  ED Course         Medications   sodium chloride (NS) flush 5-10 mL (10 mL IntraVENous Given 07/13/19 0229)   sodium chloride 0.9 % bolus infusion 1,000 mL (0 mL IntraVENous IV Completed 07/13/19 0422)   ibuprofen (MOTRIN) tablet 600 mg (600 mg Oral Given 07/13/19 0228)   iopamidoL (ISOVUE 300) 61 % contrast injection 100 mL (100 mL IntraVENous Given 07/13/19 0300)   sodium chloride (NS) flush 5-10 mL (10 mL IntraVENous Given 07/13/19 0300)     Medical Decision Making   44 year old male who presents with shortness of breath and pleuritic pain who is Covid positive.  Vital signs stable, he is never hypoxic, or  tachycardic, he has stable labs however his D-dimer was elevated and so a CTA of the chest PE protocol was obtained to rule out pulmonary embolus.  None such was found however he does have groundglass opacities bilaterally is consistent with multifocal Covid pneumonia.  At this time he does not meet admission requirements nor antibody infusion guidelines, and I believe that he is stable for outpatient management.  I gave him strict return precautions which she verbalized understanding and was discharged in hemodynamically stable condition to home to follow-up with his primary care provider.    Final Diagnosis       ICD-10-CM ICD-9-CM   1. Pneumonia due to COVID-19 virus  U07.1 480.8    J12.82 079.89   2. Pleuritic pain  R07.81 786.52       Disposition   Discharged to home      The patient was personally evaluated by myself and discussed with Dr. Alisa Graff, Pete Pelt, MD who agrees with the above assessment and plan.  Nani Skillern, PA-C  July 13, 2019  My signature above authenticates this document and my orders, the final    diagnosis (es), discharge prescription (s), and instructions in the Epic    record.  If you have any questions please contact [phone].     Nursing notes have been reviewed by the physician/ advanced practice    Clinician.    Dragon medical dictation software was used for portions of this report. Unintended voice recognition errors may occur.

## 2020-03-12 IMAGING — DX RIGHT HAND - COMPLETE 3+ VIEW
3 series · 3 of 3 positions shown · non-contrast
Comparison: None.

CLINICAL DATA: Pain after striking punching bag one day prior, pain
at first and second metacarpal.

EXAM:
RIGHT HAND - COMPLETE 3+ VIEW

[hand pa]
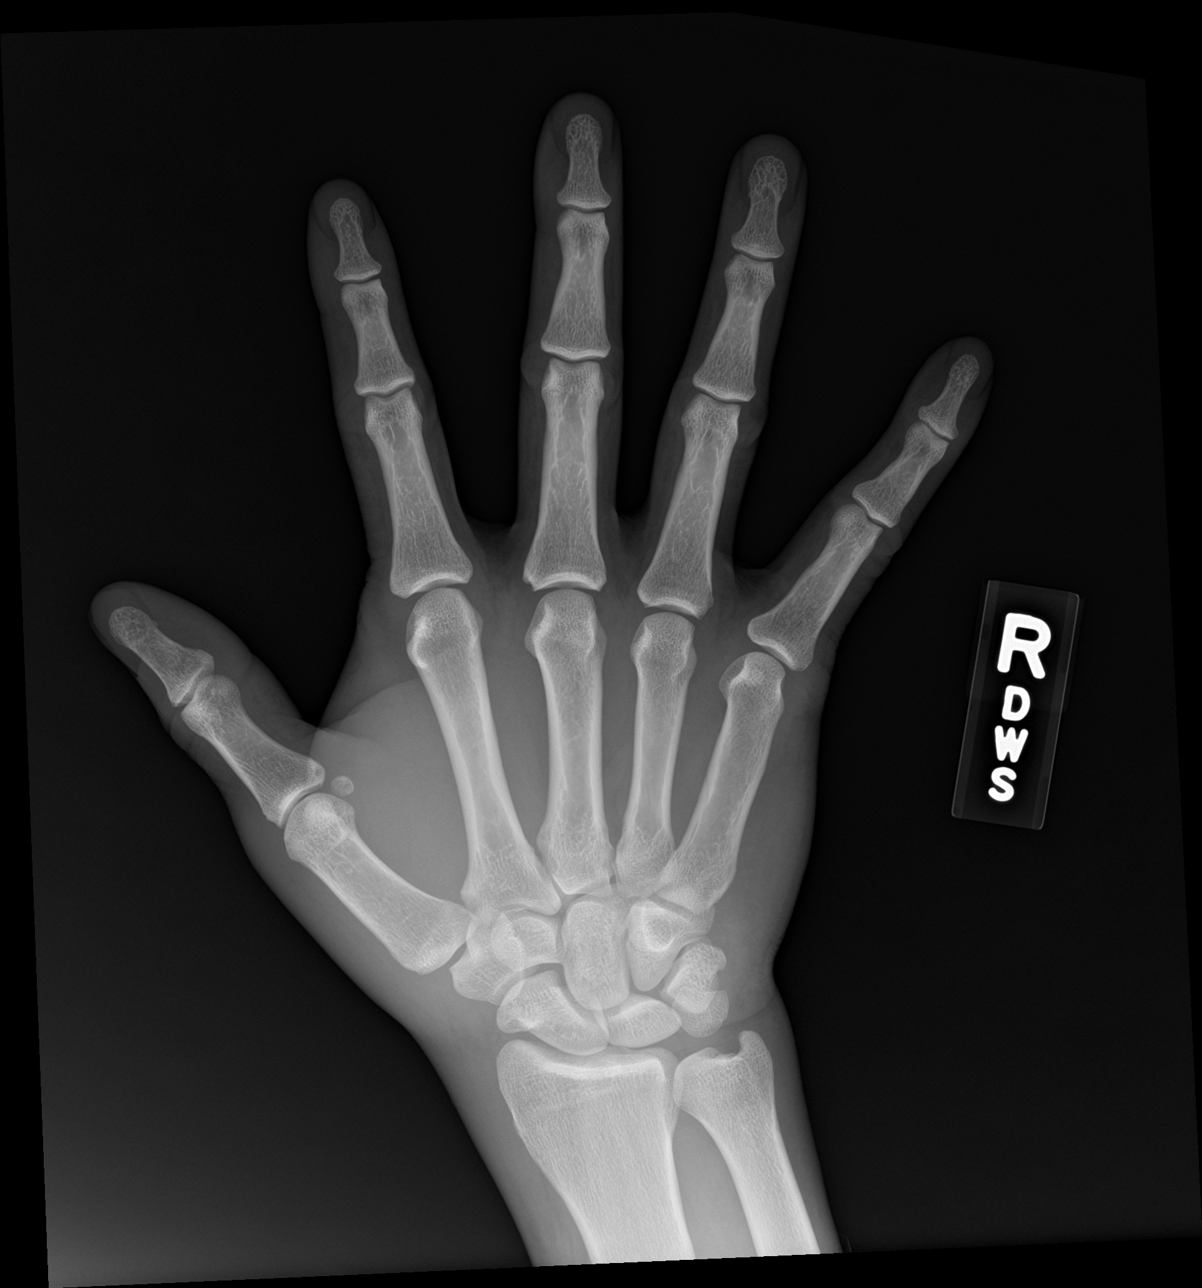

[hand obl]
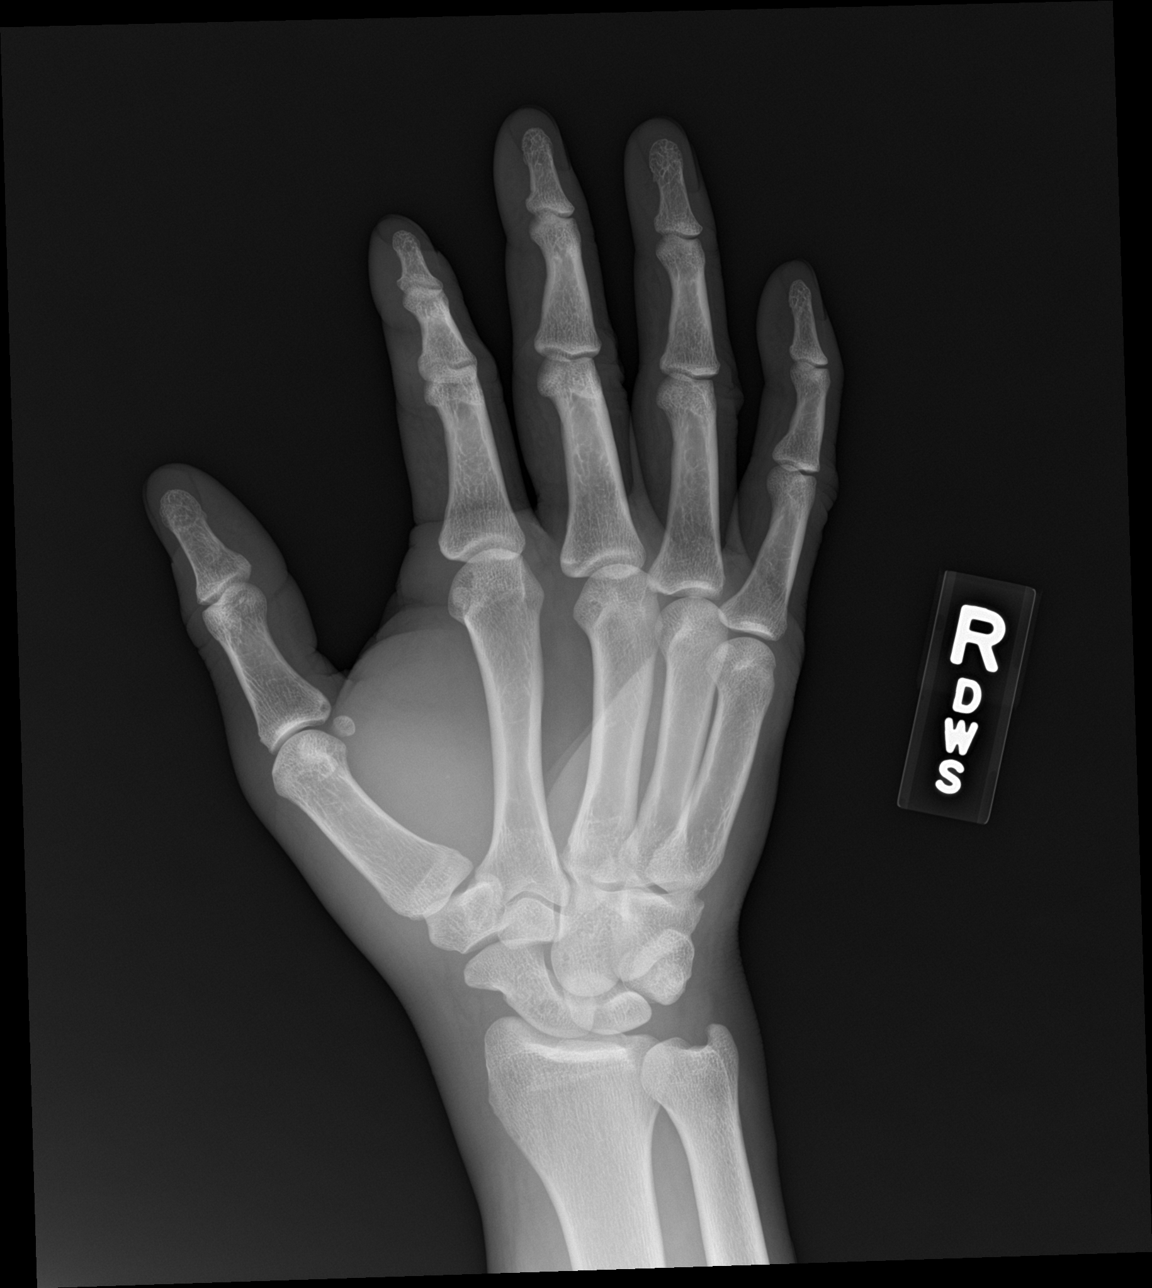

[hand lat]
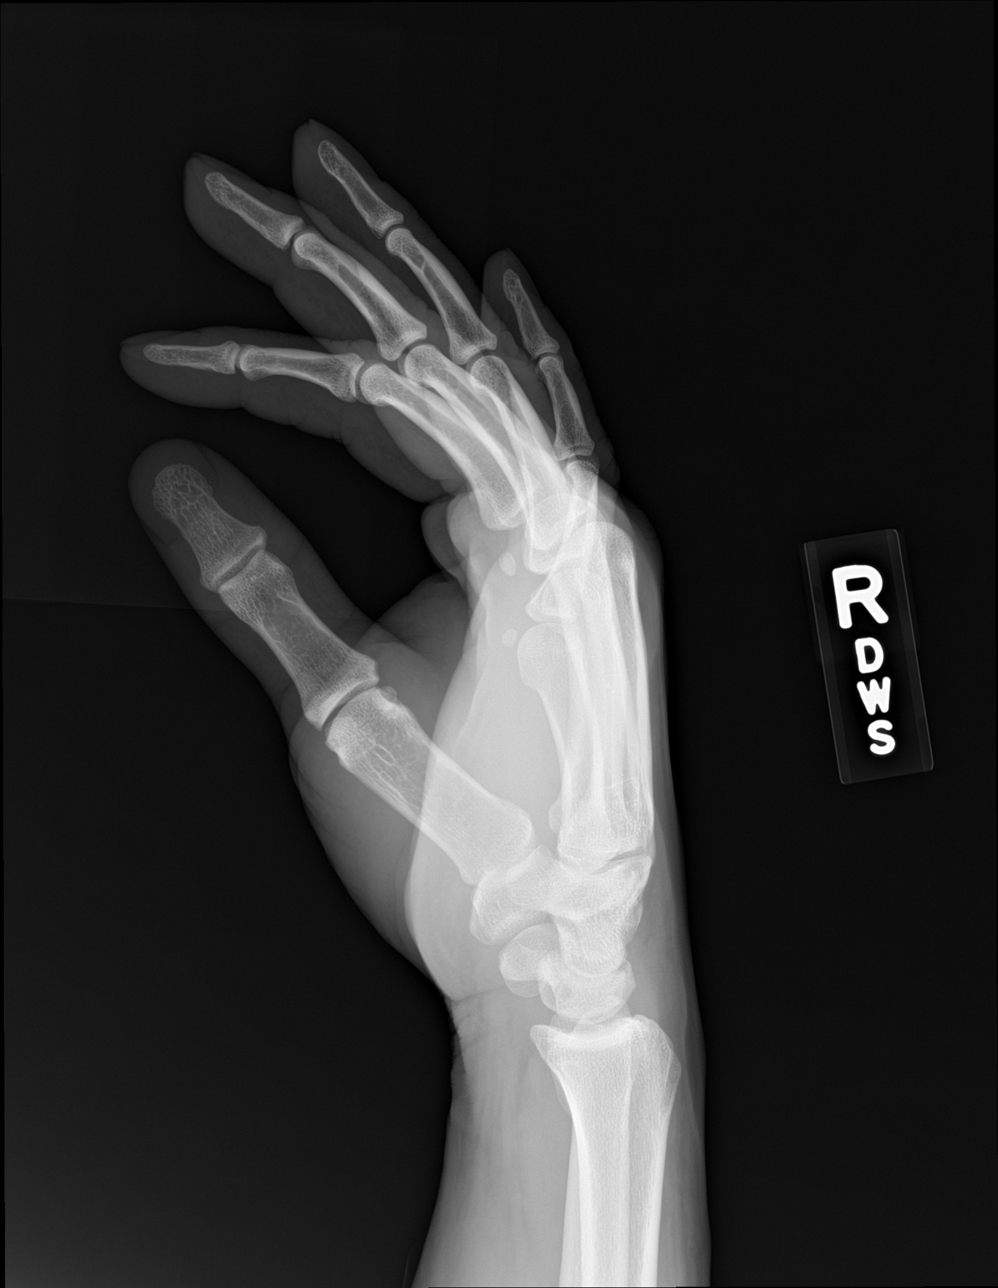

[3 of 3 positions shown; findings below may reference images not displayed]

FINDINGS: There is no evidence of fracture or dislocation. There is no
evidence of arthropathy or other focal bone abnormality. Soft
tissues are unremarkable.
IMPRESSION: Negative.

## 2021-05-05 ENCOUNTER — Ambulatory Visit (HOSPITAL_COMMUNITY)
Admission: EM | Admit: 2021-05-05 | Discharge: 2021-05-05 | Disposition: A | Discharge status: Home / Self Care | Payer: BLUE CROSS/BLUE SHIELD | Attending: Family Medicine | Admitting: Family Medicine

## 2021-05-05 ENCOUNTER — Encounter (HOSPITAL_COMMUNITY): Payer: Self-pay

## 2021-05-05 DIAGNOSIS — Z202 Contact with and (suspected) exposure to infections with a predominantly sexual mode of transmission: Secondary | ICD-10-CM | POA: Insufficient documentation

## 2021-05-05 MED ORDER — DOXYCYCLINE HYCLATE 100 MG PO CAPS: 100.0000 mg | ORAL_CAPSULE | Freq: Two times a day (BID) | ORAL | 0 refills | Status: DC

## 2021-05-05 NOTE — Discharge Instructions (Signed)
You have been given the following today for treatment of chlamydia: ? ?Please pick up your prescription for doxycycline 100 mg and begin taking twice daily for the next seven (7) days. ? ?Even though we have treated you today, we have sent testing for sexually transmitted infections. We will notify you of any positive results once they are received. If required, we will prescribe any medications you might need. ? ?Please refrain from all sexual activity for at least the next seven days. ? ?

## 2021-05-05 NOTE — ED Triage Notes (Signed)
Pt presents with c/o penile burning. Pt states his partner tested positive for Chlamydia.  ?

## 2021-05-05 NOTE — ED Provider Notes (Signed)
?  [facility] ? ? ?[id number] ?05/05/21 Arrival Time: 1338 ? ?ASSESSMENT & PLAN: ? ?1. STD exposure   ? ?Meds ordered this encounter  ?Medications  ? doxycycline (VIBRAMYCIN) 100 MG capsule  ?  Sig: Take 1 capsule (100 mg total) by mouth 2 (two) times daily.  ?  Dispense:  14 capsule  ?  Refill:  0  ? ? ? ? ?Discharge Instructions   ? ?  ?You have been given the following today for treatment of chlamydia: ? ?Please pick up your prescription for doxycycline 100 mg and begin taking twice daily for the next seven (7) days. ? ?Even though we have treated you today, we have sent testing for sexually transmitted infections. We will notify you of any positive results once they are received. If required, we will prescribe any medications you might need. ? ?Please refrain from all sexual activity for at least the next seven days. ? ? ? ? ?Pending: ?Labs Reviewed  ?CYTOLOGY, (ORAL, ANAL, URETHRAL) ANCILLARY ONLY  ? ? ?Will notify of any positive results. Instructed to refrain from sexual activity for at least seven days. ? ?Reviewed expectations re: course of current medical issues. Questions answered. ?Outlined signs and symptoms indicating need for more acute intervention. ?Patient verbalized understanding. ?After Visit Summary given. ? ? ?SUBJECTIVE: ? ?Maurice Gonzales is a 46 y.o. male who reports chlamydia exposure; girlfriend with recent + test. Would like to be treated. No symptoms. ? ?OBJECTIVE: ? ?Vitals:  ? 05/05/21 1620  ?BP: (!) 148/95  ?Pulse: 68  ?Resp: 20  ?Temp: 97.7 ?F (36.5 ?C)  ?TempSrc: Oral  ?SpO2: 95%  ?  ? ?General appearance: alert, cooperative, appears stated age and no distress ?GU: deferred ?Skin: warm and dry ?Psychological: alert and cooperative; normal mood and affect. ? ? ? ?Labs Reviewed  ?CYTOLOGY, (ORAL, ANAL, URETHRAL) ANCILLARY ONLY  ? ? ?No Known Allergies ? ?History reviewed. No pertinent past medical history. ?Family History  ?Problem Relation Age of Onset  ? Healthy Mother   ?  Healthy Father   ? ?Social History  ? ?Socioeconomic History  ? Marital status: Single  ?  Spouse name: Not on file  ? Number of children: Not on file  ? Years of education: Not on file  ? Highest education level: Not on file  ?Occupational History  ? Not on file  ?Tobacco Use  ? Smoking status: Light Smoker  ?  Types: Cigars  ? Smokeless tobacco: Never  ? Tobacco comments:  ?  smokes cigar every "once in a blue moon"  ?Vaping Use  ? Vaping Use: Never used  ?Substance and Sexual Activity  ? Alcohol use: Yes  ?  Comment: Occasiocally   ? Drug use: No  ? Sexual activity: Not on file  ?Other Topics Concern  ? Not on file  ?Social History Narrative  ? Not on file  ? ?Social Determinants of Health  ? ?Financial Resource Strain: Not on file  ?Food Insecurity: Not on file  ?Transportation Needs: Not on file  ?Physical Activity: Not on file  ?Stress: Not on file  ?Social Connections: Not on file  ?Intimate Partner Violence: Not on file  ? ? ? ? ? ? ? ?  ?Mardella Layman, MD ?05/05/21 1808 ? ?

## 2021-05-06 ENCOUNTER — Telehealth (HOSPITAL_COMMUNITY): Payer: Self-pay | Admitting: Emergency Medicine

## 2021-05-06 LAB — CYTOLOGY, (ORAL, ANAL, URETHRAL) ANCILLARY ONLY
Chlamydia: NEGATIVE
Comment: NEGATIVE
Comment: NEGATIVE
Comment: NORMAL
Neisseria Gonorrhea: POSITIVE — AB
Trichomonas: POSITIVE — AB

## 2021-05-06 MED ORDER — METRONIDAZOLE 500 MG PO TABS
2000.0000 mg | ORAL_TABLET | Freq: Once | ORAL | 0 refills | Status: DC
Start: 2021-05-06 — End: 2021-05-06

## 2021-05-06 MED ORDER — METRONIDAZOLE 500 MG PO TABS: 2000.0000 mg | ORAL_TABLET | Freq: Once | ORAL | 0 refills | Status: AC

## 2021-05-06 NOTE — Telephone Encounter (Signed)
PAtient returned call and wanted a different pharmacy ?

## 2021-05-11 ENCOUNTER — Ambulatory Visit (HOSPITAL_COMMUNITY): Payer: PRIVATE HEALTH INSURANCE

## 2021-05-11 ENCOUNTER — Ambulatory Visit (HOSPITAL_COMMUNITY)
Admission: EM | Admit: 2021-05-11 | Discharge: 2021-05-11 | Disposition: A | Discharge status: Home / Self Care | Payer: BLUE CROSS/BLUE SHIELD | Attending: Internal Medicine | Admitting: Internal Medicine

## 2021-05-11 DIAGNOSIS — A549 Gonococcal infection, unspecified: Secondary | ICD-10-CM | POA: Diagnosis not present

## 2021-05-11 MED ORDER — CEFTRIAXONE SODIUM 500 MG IJ SOLR
500.0000 mg | Freq: Once | INTRAMUSCULAR | Status: AC
  Administered 2021-05-11: 500 mg via INTRAMUSCULAR

## 2021-05-11 MED ORDER — CEFTRIAXONE SODIUM 500 MG IJ SOLR
INTRAMUSCULAR | Status: AC
  Filled 2021-05-11: qty 500

## 2021-05-11 NOTE — ED Triage Notes (Signed)
Pt presents for Gonorrhea treatment.  

## 2021-12-07 ENCOUNTER — Encounter (HOSPITAL_COMMUNITY): Payer: Self-pay | Admitting: *Deleted

## 2021-12-07 ENCOUNTER — Ambulatory Visit (HOSPITAL_COMMUNITY)
Admission: EM | Admit: 2021-12-07 | Discharge: 2021-12-07 | Disposition: A | Discharge status: Home / Self Care | Payer: BLUE CROSS/BLUE SHIELD | Attending: Emergency Medicine | Admitting: Emergency Medicine

## 2021-12-07 DIAGNOSIS — Z202 Contact with and (suspected) exposure to infections with a predominantly sexual mode of transmission: Secondary | ICD-10-CM

## 2021-12-07 MED ORDER — METRONIDAZOLE 500 MG PO TABS
ORAL_TABLET | ORAL | Status: AC
  Filled 2021-12-07: qty 4

## 2021-12-07 MED ORDER — METRONIDAZOLE 500 MG PO TABS
2000.0000 mg | ORAL_TABLET | Freq: Once | ORAL | Status: AC
  Administered 2021-12-07: 2000 mg via ORAL

## 2021-12-07 MED ORDER — DOXYCYCLINE HYCLATE 100 MG PO CAPS: 100.0000 mg | ORAL_CAPSULE | Freq: Two times a day (BID) | ORAL | 0 refills | Status: AC

## 2021-12-07 NOTE — ED Triage Notes (Signed)
Pts states his GF tested positive for trich and chlamydia. He states states he is having some burning.

## 2021-12-07 NOTE — ED Provider Notes (Signed)
[place]    CSN: [id number] Arrival date & time: 12/07/21  1621     History   Chief Complaint Chief Complaint  Patient presents with   [facility] TRANSMITTED DISEASE    HPI Maurice Gonzales is a 46 y.o. male.  Presents with exposure to STD Reports his girlfriend tested positive for trichomonas and chlamydia 2 weeks ago Unprotected intercourse since then Reports some discomfort and burning with urination Denies any penile discharge  History reviewed. No pertinent past medical history.  There are no problems to display for this patient.  History reviewed. No pertinent surgical history.   Home Medications    Prior to Admission medications   Medication Sig Start Date End Date Taking? Authorizing Provider  traZODone (DESYREL) 50 MG tablet Take by mouth at bedtime as needed. 10/08/21  Yes [provider]  doxycycline (VIBRAMYCIN) 100 MG capsule Take 1 capsule (100 mg total) by mouth 2 (two) times daily for 7 days. 12/07/21 12/14/21  Joyleen Haselton, Wells Guiles, PA-C  ibuprofen (ADVIL,MOTRIN) 600 MG tablet Take 1 tablet (600 mg total) by mouth every 6 (six) hours as needed. 12/12/15   Delos Haring, PA-C  lidocaine (XYLOCAINE) 2 % solution Use as directed 15 mLs in the mouth or throat every 4 (four) hours as needed for mouth pain. May swish and spit 06/18/18   Wieters, Hallie C, PA-C  LORazepam (ATIVAN) 0.5 MG tablet Take 1-2 tablets (0.5-1 mg total) by mouth 3 (three) times daily as needed for anxiety. 12/12/15   Delos Haring, PA-C  omeprazole (PRILOSEC) 20 MG capsule Take 1 capsule (20 mg total) by mouth daily. 10/15/16   Waynetta Pean, PA-C  tobramycin (TOBREX) 0.3 % ophthalmic solution Place 1 drop into both eyes every 4 (four) hours. 04/08/18   Raylene Everts, MD  VYVANSE 40 MG capsule Take 40 mg by mouth daily. 11/22/15   [provider]    Family History Family History  Problem Relation Age of Onset   Healthy Mother    Healthy Father     Social  History Social History   Tobacco Use   Smoking status: Light Smoker    Types: Cigars   Smokeless tobacco: Never   Tobacco comments:    smokes cigar every "once in a blue moon"  Vaping Use   Vaping Use: Never used  Substance Use Topics   Alcohol use: Yes    Comment: Occasiocally    Drug use: No     Allergies   Patient has no known allergies.   Review of Systems Review of Systems Per HPI  Physical Exam Triage Vital Signs ED Triage Vitals  Enc Vitals Group     BP 12/07/21 1720 (!) 142/90     Pulse Rate 12/07/21 1720 61     Resp 12/07/21 1720 18     Temp 12/07/21 1720 98 F (36.7 C)     Temp Source 12/07/21 1720 Oral     SpO2 12/07/21 1720 95 %     Weight --      Height --      Head Circumference --      Peak Flow --      Pain Score 12/07/21 1718 0     Pain Loc --      Pain Edu? --      Excl. in [place]? --    No data found.  Updated Vital Signs BP (!) 142/90 (BP Location: Right Arm)   Pulse 61   Temp 98 F (36.7 C) (  Oral)   Resp 18   SpO2 95%   Physical Exam Vitals and nursing note reviewed.  Constitutional:      General: He is not in acute distress.    Appearance: Normal appearance.  HENT:     Mouth/Throat:     Pharynx: Oropharynx is clear.  Cardiovascular:     Rate and Rhythm: Normal rate and regular rhythm.     Pulses: Normal pulses.     Heart sounds: Normal heart sounds.  Pulmonary:     Effort: Pulmonary effort is normal.     Breath sounds: Normal breath sounds.  Abdominal:     Palpations: Abdomen is soft.  Neurological:     Mental Status: He is alert and oriented to person, place, and time.     UC Treatments / Results  Labs (all labs ordered are listed, but only abnormal results are displayed) Labs Reviewed  CYTOLOGY, (ORAL, ANAL, URETHRAL) ANCILLARY ONLY    EKG  Radiology No results found.  Procedures Procedures   Medications Ordered in UC Medications  metroNIDAZOLE (FLAGYL) tablet 2,000 mg (2,000 mg Oral Given 12/07/21  1801)    Initial Impression / Assessment and Plan / UC Course  I have reviewed the triage vital signs and the nursing notes.  Pertinent labs & imaging results that were available during my care of the patient were reviewed by me and considered in my medical decision making (see chart for details).  Cytology swab pending Given exposure to positive girlfriend, will treat for chlamydia and trichomonas 2 g Flagyl given in clinic. Doxycycline 100 mg twice daily for 7 days Return precautions discussed. Patient agrees to plan  Final Clinical Impressions(s) / UC Diagnoses   Final diagnoses:  STD exposure  Trichomonas exposure  Exposure to chlamydia     Discharge Instructions      We will call you if anything on your swab returns positive You have been treated today for trichomonas. To be treated for chlamydia, please take the doxycycline twice daily for a full 7 days.    ED Prescriptions     Medication Sig Dispense Auth. Provider   doxycycline (VIBRAMYCIN) 100 MG capsule Take 1 capsule (100 mg total) by mouth 2 (two) times daily for 7 days. 14 capsule Aliviyah Malanga, Lurena Joiner, PA-C      PDMP not reviewed this encounter.   Kathrine Haddock 12/07/21 1901

## 2021-12-07 NOTE — Discharge Instructions (Addendum)
We will call you if anything on your swab returns positive You have been treated today for trichomonas. To be treated for chlamydia, please take the doxycycline twice daily for a full 7 days.

## 2021-12-08 LAB — CYTOLOGY, (ORAL, ANAL, URETHRAL) ANCILLARY ONLY
Chlamydia: NEGATIVE
Comment: NEGATIVE
Comment: NEGATIVE
Comment: NORMAL
Neisseria Gonorrhea: NEGATIVE
Trichomonas: NEGATIVE

## 2022-05-23 ENCOUNTER — Encounter: Payer: Self-pay | Admitting: *Deleted
